# Patient Record
Sex: Male | Born: 1955 | Race: White | Hispanic: No | Marital: Single | Smoking: Never smoker
Health system: Southern US, Community
[De-identification: ages and names within clinical notes are randomized; demographics above are authoritative.]

## PROBLEM LIST (undated history)

## (undated) ENCOUNTER — Emergency Department (HOSPITAL_COMMUNITY): Admission: EM | Payer: 59 | Source: Home / Self Care

## (undated) DIAGNOSIS — E785 Hyperlipidemia, unspecified: Secondary | ICD-10-CM

## (undated) DIAGNOSIS — N200 Calculus of kidney: Secondary | ICD-10-CM

## (undated) DIAGNOSIS — F1020 Alcohol dependence, uncomplicated: Secondary | ICD-10-CM

## (undated) DIAGNOSIS — I10 Essential (primary) hypertension: Secondary | ICD-10-CM

---

## 2018-06-17 ENCOUNTER — Inpatient Hospital Stay (HOSPITAL_COMMUNITY)
Admission: EM | Admit: 2018-06-17 | Discharge: 2018-06-30 | DRG: 897 | Disposition: A | Discharge status: Skilled Nursing Facility | Payer: 59 | Attending: Family Medicine | Admitting: Family Medicine

## 2018-06-17 ENCOUNTER — Encounter (HOSPITAL_COMMUNITY): Payer: Self-pay | Admitting: Emergency Medicine

## 2018-06-17 ENCOUNTER — Emergency Department (HOSPITAL_COMMUNITY): Payer: 59

## 2018-06-17 DIAGNOSIS — D72829 Elevated white blood cell count, unspecified: Secondary | ICD-10-CM | POA: Diagnosis present

## 2018-06-17 DIAGNOSIS — K7011 Alcoholic hepatitis with ascites: Secondary | ICD-10-CM | POA: Diagnosis present

## 2018-06-17 DIAGNOSIS — E8770 Fluid overload, unspecified: Secondary | ICD-10-CM | POA: Diagnosis not present

## 2018-06-17 DIAGNOSIS — R3129 Other microscopic hematuria: Secondary | ICD-10-CM | POA: Diagnosis present

## 2018-06-17 DIAGNOSIS — Z87442 Personal history of urinary calculi: Secondary | ICD-10-CM

## 2018-06-17 DIAGNOSIS — E878 Other disorders of electrolyte and fluid balance, not elsewhere classified: Secondary | ICD-10-CM | POA: Diagnosis present

## 2018-06-17 DIAGNOSIS — E876 Hypokalemia: Secondary | ICD-10-CM | POA: Diagnosis present

## 2018-06-17 DIAGNOSIS — F1010 Alcohol abuse, uncomplicated: Secondary | ICD-10-CM | POA: Diagnosis not present

## 2018-06-17 DIAGNOSIS — D539 Nutritional anemia, unspecified: Secondary | ICD-10-CM | POA: Diagnosis present

## 2018-06-17 DIAGNOSIS — K7689 Other specified diseases of liver: Secondary | ICD-10-CM | POA: Diagnosis not present

## 2018-06-17 DIAGNOSIS — F10231 Alcohol dependence with withdrawal delirium: Secondary | ICD-10-CM | POA: Diagnosis present

## 2018-06-17 DIAGNOSIS — G4700 Insomnia, unspecified: Secondary | ICD-10-CM | POA: Diagnosis present

## 2018-06-17 DIAGNOSIS — R7401 Elevation of levels of liver transaminase levels: Secondary | ICD-10-CM

## 2018-06-17 DIAGNOSIS — D696 Thrombocytopenia, unspecified: Secondary | ICD-10-CM | POA: Diagnosis not present

## 2018-06-17 DIAGNOSIS — F419 Anxiety disorder, unspecified: Secondary | ICD-10-CM | POA: Diagnosis present

## 2018-06-17 DIAGNOSIS — R740 Nonspecific elevation of levels of transaminase and lactic acid dehydrogenase [LDH]: Secondary | ICD-10-CM | POA: Diagnosis present

## 2018-06-17 DIAGNOSIS — D6959 Other secondary thrombocytopenia: Secondary | ICD-10-CM | POA: Diagnosis present

## 2018-06-17 DIAGNOSIS — E785 Hyperlipidemia, unspecified: Secondary | ICD-10-CM

## 2018-06-17 DIAGNOSIS — R4182 Altered mental status, unspecified: Secondary | ICD-10-CM | POA: Insufficient documentation

## 2018-06-17 DIAGNOSIS — E440 Moderate protein-calorie malnutrition: Secondary | ICD-10-CM | POA: Diagnosis present

## 2018-06-17 DIAGNOSIS — Z79899 Other long term (current) drug therapy: Secondary | ICD-10-CM

## 2018-06-17 DIAGNOSIS — K7040 Alcoholic hepatic failure without coma: Secondary | ICD-10-CM | POA: Diagnosis present

## 2018-06-17 DIAGNOSIS — I10 Essential (primary) hypertension: Secondary | ICD-10-CM | POA: Diagnosis present

## 2018-06-17 DIAGNOSIS — D689 Coagulation defect, unspecified: Secondary | ICD-10-CM | POA: Diagnosis present

## 2018-06-17 DIAGNOSIS — K5900 Constipation, unspecified: Secondary | ICD-10-CM | POA: Diagnosis present

## 2018-06-17 DIAGNOSIS — Z6823 Body mass index (BMI) 23.0-23.9, adult: Secondary | ICD-10-CM | POA: Diagnosis not present

## 2018-06-17 DIAGNOSIS — E871 Hypo-osmolality and hyponatremia: Secondary | ICD-10-CM | POA: Diagnosis not present

## 2018-06-17 DIAGNOSIS — K219 Gastro-esophageal reflux disease without esophagitis: Secondary | ICD-10-CM | POA: Diagnosis present

## 2018-06-17 DIAGNOSIS — K7031 Alcoholic cirrhosis of liver with ascites: Secondary | ICD-10-CM | POA: Diagnosis present

## 2018-06-17 DIAGNOSIS — D649 Anemia, unspecified: Secondary | ICD-10-CM | POA: Diagnosis not present

## 2018-06-17 DIAGNOSIS — E872 Acidosis: Secondary | ICD-10-CM | POA: Diagnosis present

## 2018-06-17 DIAGNOSIS — K7200 Acute and subacute hepatic failure without coma: Secondary | ICD-10-CM | POA: Diagnosis not present

## 2018-06-17 DIAGNOSIS — R278 Other lack of coordination: Secondary | ICD-10-CM | POA: Diagnosis present

## 2018-06-17 DIAGNOSIS — F10931 Alcohol use, unspecified with withdrawal delirium: Secondary | ICD-10-CM

## 2018-06-17 DIAGNOSIS — R198 Other specified symptoms and signs involving the digestive system and abdomen: Secondary | ICD-10-CM | POA: Diagnosis not present

## 2018-06-17 DIAGNOSIS — R188 Other ascites: Secondary | ICD-10-CM

## 2018-06-17 DIAGNOSIS — R109 Unspecified abdominal pain: Secondary | ICD-10-CM | POA: Diagnosis present

## 2018-06-17 HISTORY — DX: Alcohol dependence, uncomplicated: F10.20

## 2018-06-17 HISTORY — DX: Calculus of kidney: N20.0

## 2018-06-17 HISTORY — DX: Hyperlipidemia, unspecified: E78.5

## 2018-06-17 HISTORY — DX: Essential (primary) hypertension: I10

## 2018-06-17 LAB — CBC WITH DIFFERENTIAL/PLATELET
ABS IMMATURE GRANULOCYTES: 0.1 10*3/uL — AB (ref 0.00–0.07)
BASOS PCT: 1 %
Basophils Absolute: 0.1 10*3/uL (ref 0.0–0.1)
Eosinophils Absolute: 0.1 10*3/uL (ref 0.0–0.5)
Eosinophils Relative: 1 %
HCT: 34.4 % — ABNORMAL LOW (ref 39.0–52.0)
Hemoglobin: 11.9 g/dL — ABNORMAL LOW (ref 13.0–17.0)
IMMATURE GRANULOCYTES: 1 %
LYMPHS PCT: 11 %
Lymphs Abs: 1.4 10*3/uL (ref 0.7–4.0)
MCH: 35.2 pg — ABNORMAL HIGH (ref 26.0–34.0)
MCHC: 34.6 g/dL (ref 30.0–36.0)
MCV: 101.8 fL — AB (ref 80.0–100.0)
Monocytes Absolute: 1 10*3/uL (ref 0.1–1.0)
Monocytes Relative: 8 %
NEUTROS ABS: 10.3 10*3/uL — AB (ref 1.7–7.7)
NEUTROS PCT: 78 %
PLATELETS: 118 10*3/uL — AB (ref 150–400)
RBC: 3.38 MIL/uL — ABNORMAL LOW (ref 4.22–5.81)
RDW: 15.2 % (ref 11.5–15.5)
WBC: 13 10*3/uL — ABNORMAL HIGH (ref 4.0–10.5)
nRBC: 0 % (ref 0.0–0.2)

## 2018-06-17 LAB — URINALYSIS, ROUTINE W REFLEX MICROSCOPIC
RBC / HPF: >50 RBC/hpf — ABNORMAL HIGH (ref 0–5)
WBC, UA: >50 WBC/hpf — ABNORMAL HIGH (ref 0–5)

## 2018-06-17 LAB — I-STAT CG4 LACTIC ACID, ED: Lactic Acid, Venous: 4.53 mmol/L (ref 0.5–1.9)

## 2018-06-17 LAB — PROTIME-INR
INR: 1.98
PROTHROMBIN TIME: 22.2 s — AB (ref 11.4–15.2)

## 2018-06-17 LAB — COMPREHENSIVE METABOLIC PANEL
ALBUMIN: 2.3 g/dL — AB (ref 3.5–5.0)
ALT: 85 U/L — ABNORMAL HIGH (ref 0–44)
AST: 154 U/L — ABNORMAL HIGH (ref 15–41)
Alkaline Phosphatase: 73 U/L (ref 38–126)
Anion gap: 10 (ref 5–15)
BUN: 10 mg/dL (ref 6–20)
CHLORIDE: 87 mmol/L — AB (ref 98–111)
CO2: 29 mmol/L (ref 22–32)
Calcium: 9.6 mg/dL (ref 8.9–10.3)
Creatinine, Ser: 0.91 mg/dL (ref 0.61–1.24)
GFR calc Af Amer: >60 mL/min (ref >60)
GFR calc non Af Amer: >60 mL/min (ref >60)
GLUCOSE: 156 mg/dL — AB (ref 70–99)
Potassium: 3.3 mmol/L — ABNORMAL LOW (ref 3.5–5.1)
Sodium: 126 mmol/L — ABNORMAL LOW (ref 135–145)
Total Bilirubin: 12.8 mg/dL — ABNORMAL HIGH (ref 0.3–1.2)
Total Protein: 8.4 g/dL — ABNORMAL HIGH (ref 6.5–8.1)

## 2018-06-17 LAB — CBG MONITORING, ED: GLUCOSE-CAPILLARY: 150 mg/dL — AB (ref 70–99)

## 2018-06-17 LAB — AMMONIA: Ammonia: 31 umol/L (ref 9–35)

## 2018-06-17 MED ORDER — IOHEXOL 300 MG/ML  SOLN
100.0000 mL | Freq: Once | INTRAMUSCULAR | Status: AC | PRN
  Administered 2018-06-17: 100 mL via INTRAVENOUS

## 2018-06-17 MED ORDER — VITAMIN B-1 100 MG PO TABS
100.0000 mg | ORAL_TABLET | Freq: Every day | ORAL | Status: DC
  Administered 2018-06-18 – 2018-06-30 (×13): 100 mg via ORAL
  Filled 2018-06-17 (×13): qty 1

## 2018-06-17 MED ORDER — LORAZEPAM 2 MG/ML IJ SOLN
1.0000 mg | Freq: Four times a day (QID) | INTRAMUSCULAR | Status: DC | PRN
  Administered 2018-06-20 (×2): 1 mg via INTRAVENOUS
  Filled 2018-06-17 (×3): qty 1

## 2018-06-17 MED ORDER — SODIUM CHLORIDE 0.9 % IV BOLUS
1000.0000 mL | Freq: Once | INTRAVENOUS | Status: AC
  Administered 2018-06-17: 1000 mL via INTRAVENOUS

## 2018-06-17 MED ORDER — ADULT MULTIVITAMIN W/MINERALS CH
1.0000 | ORAL_TABLET | Freq: Every day | ORAL | Status: DC
  Administered 2018-06-18 – 2018-06-30 (×13): 1 via ORAL
  Filled 2018-06-17 (×13): qty 1

## 2018-06-17 MED ORDER — LORAZEPAM 2 MG/ML IJ SOLN
2.0000 mg | Freq: Once | INTRAMUSCULAR | Status: AC
  Administered 2018-06-18: 2 mg via INTRAVENOUS
  Filled 2018-06-17: qty 1

## 2018-06-17 MED ORDER — LACTATED RINGERS IV BOLUS
1000.0000 mL | Freq: Once | INTRAVENOUS | Status: DC
  Administered 2018-06-17: 1000 mL via INTRAVENOUS

## 2018-06-17 MED ORDER — LORAZEPAM 1 MG PO TABS
1.0000 mg | ORAL_TABLET | Freq: Four times a day (QID) | ORAL | Status: DC | PRN
  Administered 2018-06-18 (×3): 1 mg via ORAL
  Filled 2018-06-17 (×4): qty 1

## 2018-06-17 MED ORDER — FOLIC ACID 1 MG PO TABS
1.0000 mg | ORAL_TABLET | Freq: Every day | ORAL | Status: DC
  Administered 2018-06-18 – 2018-06-30 (×13): 1 mg via ORAL
  Filled 2018-06-17 (×13): qty 1

## 2018-06-17 MED ORDER — THIAMINE HCL 100 MG/ML IJ SOLN
100.0000 mg | Freq: Every day | INTRAMUSCULAR | Status: DC
  Administered 2018-06-24: 100 mg via INTRAVENOUS
  Filled 2018-06-17 (×10): qty 2

## 2018-06-17 NOTE — ED Provider Notes (Signed)
I saw and evaluated the patient, reviewed the resident's note and I agree with the findings and plan.  EKG: None Patient has history of heavy alcohol use.  He presents with confusion and jaundice.  He has become increasingly confused throughout the day.  He endorses some generalized dull abdominal pain.  Patient's last drink was this morning.  Patient is alert and interactive.  He is very talkative.no  Respiratory distress.  Abdomen is mildly distended but soft without guarding.  Patient has slight tremor and speech is pressured.  Patient has multifactorial medical issues at this time.  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  His speech is situationally oriented but pressured and likely due to early alcohol withdrawal.  Will require admission for further evaluation and management.  CRITICAL CARE Performed by: Arby Barrette   Total critical care time: 30 minutes  Critical care time was exclusive of separately billable procedures and treating other patients.  Critical care was necessary to treat or prevent imminent or life-threatening deterioration.  Critical care was time spent personally by me on the following activities: development of treatment plan with patient and/or surrogate as well as nursing, discussions with consultants, evaluation of patient's response to treatment, examination of patient, obtaining history from patient or surrogate, ordering and performing treatments and interventions, ordering and review of laboratory studies, ordering and review of radiographic studies, pulse oximetry and re-evaluation of patient's condition.    Arby Barrette, MD 06/20/18 [phone]

## 2018-06-17 NOTE — H&P (Addendum)
[facility] Admission History and Physical Service Pager: [phone]  Patient name: Alfred Kelley Medical record number: [id number] Date of birth: 06/25/1956 Age: 62 y.o. Gender: male  Primary Care Provider: Patient, No Pcp Per Consultants: None Code Status: FULL   Chief Complaint: abdominal pain, jaundice, hematuria  Assessment and Plan: Alfred Kelley is a 62 y.o. male presenting with jaundice, abdominal pain/bloating, intractable vomiting. PMH is significant for essential hypertension, HLD, h/o alcohol abuse, and ?possible h/o of hepatitis.   Jaundice/abdominal pain- Patient with h/o alcohol use disorder states that he has had hepatitis many years ago which sounds like hepatitis A from history but he is not sure and it is not in chart review. He has history of drinking problem with abstinence for several years until about 8 months ago. He drinks ~4 glasses of wine per day. He also vomits approximately 4 days per week 2/2 abdominal pain which is relieved from vomiting. Denies any blood in vomit or stool. In ED- patient received 1 dose ativan and 1L NS bolus. Vital signs stable with tachycardia in 109-132 range. CT abdomen showed signs of probable cirrhosis, possible acute hepatitis. Labs showed total protein 8.4, albumin 2.3, AST 154, ALT 85, alk phos 73, total bili 12.8, PT 22.2, INR 1.98, ammonia 31, LA 4.53, WBC 13.0. on exam- positive scleral icterus, liver edge palpated approximately 9cm below costal margin, negative fluid wave, mild diffuse tenderness on right abdomen. Very mild JVD. No apparent tremor. Symptoms likely 2/2 cirrhosis/liver dysfunction from alcohol abuse and possibly hepatitis. Patient denies IV drug use. Another possibility given patient's weight loss is malignancy however no supraclavicular lymphadenitis and he reports this weight loss is intentional over the last 5 months as he has been trying to eat healthier and exercise. Will admit for workup  of his jaundice and elevated LFTs.  -admit to telemetry, attending Dr. Gwendlyn Deutscher - continuous cardiac monitoring - trend lactic acid - hepatitis panel - UDS - alcohol level - HIV antibody - consider GI consult - vitals per unit routine  Alcohol use disorder- patient states his last drink was today. He has had alcohol problem in the past and stopped drinking for several years. He resumed drinking ~60month ago 2/2 increased work stress. He states that he usually drinks ~4 glasses of wine per day but brother reminded him that he also drinks other drinks. Patient admits to drinking as many as 5 drinks in a day to help him calm his mind and sleep at night otherwise his mind races. He has no history of mental illness and no family history of mental illness. - monitor closely on CIWA protocol; Ativan per CIWA protocol - folic acid  - vitamin B1 - thiamine - consider psychology referral - encourage alcohol cessation  Hypertension- patient has not taken blood pressure medications for 2 days 2/2 nausea.  At home on lisinopril-hydrochlorothiazide 10-12.52m  He states that his BP is well controlled normally and he is otherwise compliant. On exam, BP was 117/69.  - hold home lisinopril-hydrochlorothiazide 10-12.49m749miven low-normal pressures - restart when able - monitor BPs   Hyponatremia- Na+ 126 on admission. Likely secondary to patient admitted large alcohol and water intake. Patient may be symptomatic with confusion- brother states this is his baseline mental status. Patient received 1L NS bolus in ED. - CMP am - PO fluid restrict - monitor I/O's - daily weights  Hypokalemia- K+ 3.3 on admission. Likely secondary to patient admitted large alcohol and water intake. - CMP  am - repleted with 69mq K+  Hypochloremia- Cl 87 on admission. Likely secondary to patient admitted large alcohol and water intake. - continue to monitor - CMP am  Hematuria- h/o frequent nephrolithiasis. CT abdomen  showed punctate left nonobstructing stone. Urinalysis revealed >50 RBC, >50WBC, rare bacteria, other tests not reported due to red color interference. Patient denies any pain with urination at any time. He states that he has had 4 days of pink urine last week. This week his urine has been cranberry-colored. He denies smoking history. - outpatient urology referral  Macrocytic Anemia- CBC shows hgb 11.9, MCV 101.8 with extensive alcohol use history and AST 1956- folic acid - vitamin BL87- alcohol cessation counseling  Thrombocytopenia- platelets 118.  No signs of active bleeding except reported blood in urine several days a week.  - continue to monitor  - monitor on CBC   Hyperlipidemia- patient takes simvastatin at home. No lipid panel in chart review  - holding simvastatin 467mdaily in setting of lactic acidosis -restart when able  - consider lipid panel  Moderate protein malnutrition- albumin 2.3. Patient has abdominal bloating, negative fluid wave, no lower extremity swelling. - nutrition consult - continue to monitor  GERD- patient states that he takes tums frequently at home for abdominal pain that burns and is relieved with tums and vomiting. -TUMS PRN  FEN/GI: heart health diet, s/p 1L NS bolus in ED Prophylaxis: lovenox  Disposition: admit to tele, attending Dr. EnGwendlyn Deutscher History of Present Illness:  DaLAWERANCE MATSUOs a [age].o. male presenting with jaundice, abdominal pain/bloating, intractable nausea, increased alcohol use.  Patient states that he has had increased alcohol intake 2/2 increased work stress ~22m26monthgo after several years of abstinence. He states that he drinks ~4 glasses of wine per night to help him relax and sleep otherwise his mind races all night. Patient's brother provides additional detail that patient also drinks other hard liquor drinks often and suggests patient is not describing his full drinking habits. Brother also states that he noticed the  patient's eyes have become yellow appearing for several days and the patient will doze off several times during the day but will then wake up with "manic speech".  Patient had asked his PCP for sleep aid medication and states he was not prescribed one. Patient has associated abdominal bloating and burning that has been occurring about 4x per week for the past 3 months which is relieved with tums and vomiting. He states that he vomits large volumes ~4x per week with no blood and then feels like he can go play basketball. He has had decreased appetite for several days. He denies any diarrhea or constipation. He states that he also has had pink urine for 4 days last week which resolved with him consuming large amounts of water. He states that he usually drinks ~3bottles of water per day. His urine has now started to appear like cranberry juice today despite consuming the water. He denies any pain with urination but does have history of kidney stones. Patient states that he has lost about 30 pounds in the past 5 months from eating a healthy diet and no exercise. He denies any fevers, night sweats, or chills. Patient has a history of hypertension but has not taken his blood pressure medication in 2 days 2/2 nausea. He has a history of hepatitis several years ago associated with "orange juice" urine and nausea for which he was treated by a doctor at [employer]  [facility]- he has not followed up since- likely HepA.  He has a history of several kidney stones not requiring intervention in the past.  Review Of Systems: Per HPI with the following additions:   Review of Systems  Constitutional: Positive for malaise/fatigue and weight loss. Negative for chills, diaphoresis and fever.  HENT: Positive for congestion.   Cardiovascular: Negative for chest pain, palpitations and leg swelling.  Gastrointestinal: Positive for abdominal pain, heartburn, nausea and vomiting. Negative for blood in stool, constipation, diarrhea and  melena.  Genitourinary: Positive for hematuria. Negative for dysuria, flank pain, frequency and urgency.  Musculoskeletal: Negative for back pain and joint pain.  Skin: Negative for rash.  Neurological: Positive for weakness. Negative for tremors.  Endo/Heme/Allergies: Positive for polydipsia.  Psychiatric/Behavioral: Positive for substance abuse. The patient has insomnia.    Patient Active Problem List   Diagnosis Date Noted  . Liver dysfunction 06/17/2018  . Altered mental status   . Hyperbilirubinemia   . Hyponatremia    Past Medical History: Past Medical History:  Diagnosis Date  . Alcoholism ([place])   . Hyperlipidemia   . Hypertension   . Kidney stones    Past Surgical History: History reviewed. No pertinent surgical history.  Social History: Social History   Tobacco Use  . Smoking status: Never Smoker  . Smokeless tobacco: Never Used  Substance Use Topics  . Alcohol use: Yes    Alcohol/week: 5.0 standard drinks    Types: 5 Glasses of wine per week    Comment: daily  . Drug use: Never   Additional social history:  Lives at home alone, never smoker, denies h/o illicit drug use.  Please also refer to relevant sections of EMR.  Family History: No family history on file.  No family history of mental illness  Allergies and Medications: No Known Allergies No current facility-administered medications on file prior to encounter.    Current Outpatient Medications on File Prior to Encounter  Medication Sig Dispense Refill  . lisinopril-hydrochlorothiazide (PRINZIDE,ZESTORETIC) 10-12.5 MG tablet Take 1 tablet by mouth daily.  0  . simvastatin (ZOCOR) 40 MG tablet Take 40 mg by mouth daily.   5   Objective: BP 117/69   Pulse (!) 116   Temp 98.3 F (36.8 C) (Oral)   Resp 18   Ht 5' 9"  (1.753 m)   Wt 72.6 kg   SpO2 97%   BMI 23.63 kg/m    Exam: General: well-appearing, NAD Eyes: mild scleral icterus, PERRLA ENTM: moist mucous membranes, multiple circular  lesions on tongue Neck: soft, non-tender to palpation, no cervical or supraclavicular lymphadenopathy Cardiovascular: tachycardic, no murmur appreciated Respiratory: CTAB Gastrointestinal: active BS diffusely, no tenderness to palpation on left, mild tenderness on right, liver edge palpated ~9cm below costal margin, negative fluid wave, negative murphy's sign, no tremor noted MSK: no gross abnormalities Derm: no rashes noted, few scattered seborrhoic keratoses on back/shoulders. Solitary scab on right shin  Neuro: alert and oriented, no focal deficits Psych: follows command, cooperative, repetitive and tangential speech pattern  Labs and Imaging: CBC BMET  Recent Labs  Lab 06/17/18 2047  WBC 13.0*  HGB 11.9*  HCT 34.4*  PLT 118*   Recent Labs  Lab 06/17/18 2047  NA 126*  K 3.3*  CL 87*  CO2 29  BUN 10  CREATININE 0.91  GLUCOSE 156*  CALCIUM 9.6     Dg Chest 1 View  Result Date: 06/17/2018 CLINICAL DATA:  Tachycardia.  Possible alcohol withdrawal. EXAM: CHEST  1  VIEW COMPARISON:  None. FINDINGS: The heart size and mediastinal contours are within normal limits. Both lungs are clear. The visualized skeletal structures are unremarkable. IMPRESSION: Clear lungs. Electronically Signed   By: Ulyses Jarred M.D.   On: 06/17/2018 22:16   Ct Abdomen Pelvis W Contrast  Result Date: 06/17/2018 CLINICAL DATA:  62 y/o  M; acute generalized abdominal pain. EXAM: CT ABDOMEN AND PELVIS WITH CONTRAST TECHNIQUE: Multidetector CT imaging of the abdomen and pelvis was performed using the standard protocol following bolus administration of intravenous contrast. CONTRAST:  100 cc Omnipaque 300 COMPARISON:  None. FINDINGS: Lower chest: No acute abnormality. Hepatobiliary: Decreased attenuation in diffuse heterogeneity of the liver. Recanalization of umbilical vein and mesenteric collateralization. No focal liver lesion. Normal appearance of the gallbladder. No biliary ductal dilatation. Pancreas:  Unremarkable. No pancreatic ductal dilatation or surrounding inflammatory changes. Spleen: Normal in size without focal abnormality. Adrenals/Urinary Tract: Adrenal glands are unremarkable. Punctate left kidney interpolar nonobstructing stone. Ureter stone or hydronephrosis. Normal bladder. Stomach/Bowel: Stomach is within normal limits. Appendix appears normal. No evidence of bowel wall thickening, distention, or inflammatory changes. Vascular/Lymphatic: Aortic atherosclerosis. No enlarged abdominal or pelvic lymph nodes. Reproductive: Prostate is unremarkable. Other: Small volume of ascites. Musculoskeletal: No fracture is seen. IMPRESSION: 1. Heterogeneity of the liver parenchyma, small volume ascites, recanalization of the umbilical vein, and mesenteric collaterals. 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 2.  Aortic Atherosclerosis (ICD10-I70.0). 3. Punctate left kidney nonobstructing stone. Electronically Signed   By: Kristine Garbe M.D.   On: 06/17/2018 22:50    Richarda Osmond, DO 06/17/2018, 11:55 PM PGY-1, [place] Intern pager: [phone], text pages welcome  I have seen and evaluated the above patient with Dr. Ouida Sills and agree with her documentation.  I have included Alfred edits in blue.   Lovenia Kim MD [place] PGY-3

## 2018-06-17 NOTE — ED Provider Notes (Signed)
[facility] EMERGENCY DEPARTMENT Provider Note   CSN: [id number] Arrival date & time: 06/17/18  2029     History   Chief Complaint Chief Complaint  Patient presents with  . Abdominal Pain  . Anxiety  . Altered Mental Status    HPI Alfred Kelley is a 62 y.o. male.  HPI Patient is a 62 year old male with a past medical history of alcoholism, hyperlipidemia, hypertension, and recurrent nephrolithiasis who presents to the emergency department for evaluation of confusion and jaundice.  Patient is accompanied by his brother who provides part of the history. During my interview with the patient, he reports that his reason for coming to the emergency department today was for evaluation of hematuria.  That has been present for approximately 5 days.  He also reports mild, generalized abdominal pain that is dull and achy in nature nonradiating that has been present for the same amount of time.  However, on exam patient is very repetitive and does seem somewhat confused despite being alert and oriented x3.  I spoke with patient's brother individually who also reports that his brother has seemed very confused throughout the day.  He reports that he has been drinking excessively secondary to stress related to his job.  Reports that after starting to drink more he has noticed his brother has become more disheveled in appearance, more jaundiced, and generally more confused.  Also of note, patient reportedly has lost approximately 30 pounds over the last 4 months.  States that this is intentional in an attempt to become more healthy.  Remaining review of systems is as detailed below.  Past Medical History:  Diagnosis Date  . Alcoholism (HCC)   . Hyperlipidemia   . Hypertension   . Kidney stones     Patient Active Problem List   Diagnosis Date Noted  . Liver dysfunction 06/17/2018  . Altered mental status   . Hyperbilirubinemia   . Hyponatremia     History reviewed. No  pertinent surgical history.      Home Medications    Prior to Admission medications   Medication Sig Start Date End Date Taking? Authorizing Provider  lisinopril-hydrochlorothiazide (PRINZIDE,ZESTORETIC) 10-12.5 MG tablet Take 1 tablet by mouth daily. 03/17/18  Yes [provider]  simvastatin (ZOCOR) 40 MG tablet Take 40 mg by mouth daily.  05/26/18  Yes [provider]    Family History No family history on file.  Social History Social History   Tobacco Use  . Smoking status: Never Smoker  . Smokeless tobacco: Never Used  Substance Use Topics  . Alcohol use: Yes    Alcohol/week: 5.0 standard drinks    Types: 5 Glasses of wine per week    Comment: daily  . Drug use: Never     Allergies   Patient has no known allergies.   Review of Systems Review of Systems  Constitutional: Negative for chills and fever.  HENT: Negative for ear pain and sore throat.   Eyes: Negative for pain and visual disturbance.  Respiratory: Negative for cough and shortness of breath.   Cardiovascular: Negative for chest pain and palpitations.  Gastrointestinal: Positive for abdominal pain. Negative for vomiting.  Endocrine: Negative for polyuria.  Genitourinary: Positive for hematuria. Negative for difficulty urinating, dysuria, flank pain, frequency and urgency.  Musculoskeletal: Negative for arthralgias and back pain.  Skin: Negative for color change and rash.  Neurological: Negative for seizures and syncope.  Psychiatric/Behavioral: Positive for confusion.  All other systems reviewed and are  negative.    Physical Exam Updated Vital Signs BP 117/69   Pulse (!) 116   Temp 98.3 F (36.8 C) (Oral)   Resp 18   Ht 5\' 9"  (1.753 m)   Wt 72.6 kg   SpO2 97%   BMI 23.63 kg/m   Physical Exam  Constitutional: He is oriented to person, place, and time. He appears well-developed and well-nourished.  HENT:  Head: Normocephalic and atraumatic.  Eyes: Conjunctivae are  normal. Scleral icterus is present.  Neck: Neck supple.  Cardiovascular: Regular rhythm. Tachycardia present.  Pulmonary/Chest: Effort normal and breath sounds normal. No respiratory distress.  Abdominal: Soft. He exhibits distension and ascites. There is no tenderness.  Musculoskeletal: He exhibits edema (Bilateral lower extremity's.).  Neurological: He is alert and oriented to person, place, and time. No cranial nerve deficit or sensory deficit.  Patient very repetitive during interview.  Skin: Skin is warm and dry.  Patient with jaundice on exam.  Psychiatric: He has a normal mood and affect.  Nursing note and vitals reviewed.    ED Treatments / Results  Labs (all labs ordered are listed, but only abnormal results are displayed) Labs Reviewed  CBC WITH DIFFERENTIAL/PLATELET - Abnormal; Notable for the following components:      Result Value   WBC 13.0 (*)    RBC 3.38 (*)    Hemoglobin 11.9 (*)    HCT 34.4 (*)    MCV 101.8 (*)    MCH 35.2 (*)    Platelets 118 (*)    Neutro Abs 10.3 (*)    Abs Immature Granulocytes 0.10 (*)    All other components within normal limits  COMPREHENSIVE METABOLIC PANEL - Abnormal; Notable for the following components:   Sodium 126 (*)    Potassium 3.3 (*)    Chloride 87 (*)    Glucose, Bld 156 (*)    Total Protein 8.4 (*)    Albumin 2.3 (*)    AST 154 (*)    ALT 85 (*)    Total Bilirubin 12.8 (*)    All other components within normal limits  PROTIME-INR - Abnormal; Notable for the following components:   Prothrombin Time 22.2 (*)    All other components within normal limits  URINALYSIS, ROUTINE W REFLEX MICROSCOPIC - Abnormal; Notable for the following components:   Color, Urine RED (*)    APPearance TURBID (*)    Glucose, UA   (*)    Value: TEST NOT REPORTED DUE TO COLOR INTERFERENCE OF URINE PIGMENT   Hgb urine dipstick   (*)    Value: TEST NOT REPORTED DUE TO COLOR INTERFERENCE OF URINE PIGMENT   Bilirubin Urine   (*)    Value:  TEST NOT REPORTED DUE TO COLOR INTERFERENCE OF URINE PIGMENT   Ketones, ur   (*)    Value: TEST NOT REPORTED DUE TO COLOR INTERFERENCE OF URINE PIGMENT   Protein, ur   (*)    Value: TEST NOT REPORTED DUE TO COLOR INTERFERENCE OF URINE PIGMENT   Nitrite   (*)    Value: TEST NOT REPORTED DUE TO COLOR INTERFERENCE OF URINE PIGMENT   Leukocytes, UA   (*)    Value: TEST NOT REPORTED DUE TO COLOR INTERFERENCE OF URINE PIGMENT   RBC / HPF >50 (*)    WBC, UA >50 (*)    Bacteria, UA RARE (*)    All other components within normal limits  CBG MONITORING, ED - Abnormal; Notable for the following components:  Glucose-Capillary 150 (*)    All other components within normal limits  I-STAT CG4 LACTIC ACID, ED - Abnormal; Notable for the following components:   Lactic Acid, Venous 4.53 (*)    All other components within normal limits  AMMONIA  I-STAT CG4 LACTIC ACID, ED  I-STAT CG4 LACTIC ACID, ED    EKG None  Radiology Dg Chest 1 View  Result Date: 06/17/2018 CLINICAL DATA:  Tachycardia.  Possible alcohol withdrawal. EXAM: CHEST  1 VIEW COMPARISON:  None. FINDINGS: The heart size and mediastinal contours are within normal limits. Both lungs are clear. The visualized skeletal structures are unremarkable. IMPRESSION: Clear lungs. Electronically Signed   By: Deatra Robinson M.D.   On: 06/17/2018 22:16   Ct Abdomen Pelvis W Contrast  Result Date: 06/17/2018 CLINICAL DATA:  62 y/o  M; acute generalized abdominal pain. EXAM: CT ABDOMEN AND PELVIS WITH CONTRAST TECHNIQUE: Multidetector CT imaging of the abdomen and pelvis was performed using the standard protocol following bolus administration of intravenous contrast. CONTRAST:  100 cc Omnipaque 300 COMPARISON:  None. FINDINGS: Lower chest: No acute abnormality. Hepatobiliary: Decreased attenuation in diffuse heterogeneity of the liver. Recanalization of umbilical vein and mesenteric collateralization. No focal liver lesion. Normal appearance of the  gallbladder. No biliary ductal dilatation. Pancreas: Unremarkable. No pancreatic ductal dilatation or surrounding inflammatory changes. Spleen: Normal in size without focal abnormality. Adrenals/Urinary Tract: Adrenal glands are unremarkable. Punctate left kidney interpolar nonobstructing stone. Ureter stone or hydronephrosis. Normal bladder. Stomach/Bowel: Stomach is within normal limits. Appendix appears normal. No evidence of bowel wall thickening, distention, or inflammatory changes. Vascular/Lymphatic: Aortic atherosclerosis. No enlarged abdominal or pelvic lymph nodes. Reproductive: Prostate is unremarkable. Other: Small volume of ascites. Musculoskeletal: No fracture is seen. IMPRESSION: 1. Heterogeneity of the liver parenchyma, small volume ascites, recanalization of the umbilical vein, and mesenteric collaterals. 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 2.  Aortic Atherosclerosis (ICD10-I70.0). 3. Punctate left kidney nonobstructing stone. Electronically Signed   By: Mitzi Hansen M.D.   On: 06/17/2018 22:50    Procedures Procedures (including critical care time)  Medications Ordered in ED Medications  sodium chloride 0.9 % bolus 1,000 mL (1,000 mLs Intravenous New Bag/Given 06/17/18 2241)  LORazepam (ATIVAN) tablet 1 mg (has no administration in time range)    Or  LORazepam (ATIVAN) injection 1 mg (has no administration in time range)  thiamine (VITAMIN B-1) tablet 100 mg (has no administration in time range)    Or  thiamine (B-1) injection 100 mg (has no administration in time range)  folic acid (FOLVITE) tablet 1 mg (has no administration in time range)  multivitamin with minerals tablet 1 tablet (has no administration in time range)  LORazepam (ATIVAN) injection 2 mg (has no administration in time range)  iohexol (OMNIPAQUE) 300 MG/ML solution 100 mL (100 mLs Intravenous Contrast Given 06/17/18 2221)     Initial Impression / Assessment  and Plan / ED Course  I have reviewed the triage vital signs and the nursing notes.  Pertinent labs & imaging results that were available during my care of the patient were reviewed by me and considered in my medical decision making (see chart for details).     Patient is a 62 year old male with past medical history as detailed above who presents emergency department for evaluation of multiple complaints.  Patient's chief complaint is of hematuria, however his brother who is present with him at the bedside is concerned that patient is much more confused than his baseline and has  noticed worsening of patient's scleral icterus.  Secondary to patient's arrival complaint multiple laboratory studies were obtained.  Patient found to have multiple abnormalities most notable for hyponatremia, significantly elevated bilirubin, hematuria, elevated white blood cell, and significantly elevated lactic acid.  Patient's CT of the abdomen and pelvis shows heterogenicity of the liver and moderate ascites consistent with cirrhosis versus acute hepatitis.  Patient was given IV fluids and put on CIWA protocol given his history of alcoholism.  He was given a one-time dose of Ativan intravenously while in the emergency department secondary to tachycardia and some generalized tremors.  Given patient's presenting symptoms and concerning laboratory findings the internal medicine service was consulted for admission.  The patient will be accepted to their service for further evaluation and care.  Patient with no further acute events while under my care.  At this time, I believe patient's presentation is consistent with worsening liver failure most likely secondary to his history of alcoholism.  Other causes of acute liver failure will need to be evaluated during patient's inpatient stay.  I believe patient's hematuria may be secondary to his kidney stone seen on CT.  Patient does have tachycardia and an elevated white blood cell  count, however he is not febrile and does not have any infectious symptoms at this time.  As a result patient was not started on antibiotics while in the emergency department.  For further information regarding patient's continued work-up and hospital course please see admitting team's documentation.  The care of this patient was discussed with my attending physician Dr. Donnald Garre, who voices agreement with work-up and ED disposition.   Final Clinical Impressions(s) / ED Diagnoses   Final diagnoses:  Hyperbilirubinemia  Hyponatremia  Altered mental status, unspecified altered mental status type    ED Discharge Orders    None       Brylea Pita, Winfield Rast, MD 06/18/18 1914    Arby Barrette, MD 06/20/18 7829

## 2018-06-17 NOTE — ED Triage Notes (Signed)
Per patient has been under increased stress at work and has been drinking a little more than he should.  [facility] told [facility] at [facility] that he has been altered and he thinks he may be going through withdrawals.  Patient reports abdominal bloating and appears anxious in triage.

## 2018-06-17 NOTE — ED Notes (Signed)
Unable to draw labs x1 

## 2018-06-17 NOTE — ED Provider Notes (Signed)
Patient placed in Quick Look pathway, seen and evaluated   Chief Complaint: Confusion, jaundice, regular alcohol use.  HPI:   Job has become extremely stressful. Has been drinking alcohol daily for years. Is not followed by a GP. His brother brought him in today because of concern that he appears to be more yellow in color, has been confused and repeating himself.  Patient reports that his abdomen is bloated.  Has also had blood in his urine, vomiting today and has had diarrhea.  He has epigastric pain and has been eating a lot of Tums.  He has gone from 190lbs to 160lbs in 4 months, although reports that he has been eating healthier.  Last drink was earlier this morning.  ROS: No fever  Physical Exam:   Gen: No distress  Neuro: Awake and Alert  Skin: Warm    Focused Exam: Tremors in bilateral hands, no asterixis.  Abdomen with fluid wave.  Mucous membranes appear jaundice.  He also has scleral icterus.  1+ pitting edema over bilateral shins.   Initiation of care has begun. The patient has been counseled on the process, plan, and necessity for staying for the completion/evaluation, and the remainder of the medical screening examination    Lawrence Marseilles 06/17/18 2052    Arby Barrette, MD 06/20/18 0021

## 2018-06-18 DIAGNOSIS — F1010 Alcohol abuse, uncomplicated: Secondary | ICD-10-CM

## 2018-06-18 DIAGNOSIS — K7200 Acute and subacute hepatic failure without coma: Secondary | ICD-10-CM | POA: Diagnosis present

## 2018-06-18 LAB — HIV ANTIBODY (ROUTINE TESTING W REFLEX): HIV SCREEN 4TH GENERATION: NONREACTIVE

## 2018-06-18 LAB — CBC
HEMATOCRIT: 29.3 % — AB (ref 39.0–52.0)
Hemoglobin: 10.6 g/dL — ABNORMAL LOW (ref 13.0–17.0)
MCH: 36.2 pg — AB (ref 26.0–34.0)
MCHC: 36.2 g/dL — AB (ref 30.0–36.0)
MCV: 100 fL (ref 80.0–100.0)
Platelets: 103 10*3/uL — ABNORMAL LOW (ref 150–400)
RBC: 2.93 MIL/uL — ABNORMAL LOW (ref 4.22–5.81)
RDW: 15.1 % (ref 11.5–15.5)
WBC: 9.9 10*3/uL (ref 4.0–10.5)
nRBC: 0 % (ref 0.0–0.2)

## 2018-06-18 LAB — OSMOLALITY, URINE: Osmolality, Ur: 521 mOsm/kg (ref 300–900)

## 2018-06-18 LAB — RAPID URINE DRUG SCREEN, HOSP PERFORMED
Amphetamines: NOT DETECTED
BARBITURATES: NOT DETECTED
BENZODIAZEPINES: NOT DETECTED
Cocaine: NOT DETECTED
Opiates: NOT DETECTED
Tetrahydrocannabinol: NOT DETECTED

## 2018-06-18 LAB — LACTIC ACID, PLASMA: Lactic Acid, Venous: 1.9 mmol/L (ref 0.5–1.9)

## 2018-06-18 LAB — COMPREHENSIVE METABOLIC PANEL
ALK PHOS: 57 U/L (ref 38–126)
ALT: 66 U/L — AB (ref 0–44)
ANION GAP: 5 (ref 5–15)
AST: 113 U/L — AB (ref 15–41)
Albumin: 1.8 g/dL — ABNORMAL LOW (ref 3.5–5.0)
BUN: 9 mg/dL (ref 6–20)
CALCIUM: 8.6 mg/dL — AB (ref 8.9–10.3)
CO2: 30 mmol/L (ref 22–32)
CREATININE: 0.81 mg/dL (ref 0.61–1.24)
Chloride: 93 mmol/L — ABNORMAL LOW (ref 98–111)
GFR calc Af Amer: >60 mL/min (ref >60)
GFR calc non Af Amer: >60 mL/min (ref >60)
GLUCOSE: 144 mg/dL — AB (ref 70–99)
Potassium: 3 mmol/L — ABNORMAL LOW (ref 3.5–5.1)
SODIUM: 128 mmol/L — AB (ref 135–145)
Total Bilirubin: 10.5 mg/dL — ABNORMAL HIGH (ref 0.3–1.2)
Total Protein: 6.8 g/dL (ref 6.5–8.1)

## 2018-06-18 LAB — ACETAMINOPHEN LEVEL: Acetaminophen (Tylenol), Serum: <10 ug/mL — ABNORMAL LOW (ref 10–30)

## 2018-06-18 LAB — BILIRUBIN, FRACTIONATED(TOT/DIR/INDIR)
BILIRUBIN DIRECT: 3.5 mg/dL — AB (ref 0.0–0.2)
BILIRUBIN INDIRECT: 6.6 mg/dL — AB (ref 0.3–0.9)
Total Bilirubin: 10.1 mg/dL — ABNORMAL HIGH (ref 0.3–1.2)

## 2018-06-18 LAB — APTT: aPTT: 46 seconds — ABNORMAL HIGH (ref 24–36)

## 2018-06-18 LAB — PROTIME-INR
INR: 2.12
Prothrombin Time: 23.4 seconds — ABNORMAL HIGH (ref 11.4–15.2)

## 2018-06-18 LAB — ETHANOL

## 2018-06-18 LAB — OSMOLALITY: Osmolality: 282 mOsm/kg (ref 275–295)

## 2018-06-18 LAB — I-STAT CG4 LACTIC ACID, ED: Lactic Acid, Venous: 2.62 mmol/L (ref 0.5–1.9)

## 2018-06-18 LAB — GAMMA GT: GGT: 80 U/L — AB (ref 7–50)

## 2018-06-18 LAB — AMMONIA: Ammonia: 44 umol/L — ABNORMAL HIGH (ref 9–35)

## 2018-06-18 LAB — TSH: TSH: 2.327 u[IU]/mL (ref 0.350–4.500)

## 2018-06-18 LAB — SODIUM, URINE, RANDOM

## 2018-06-18 MED ORDER — CALCIUM CARBONATE ANTACID 500 MG PO CHEW
1.0000 | CHEWABLE_TABLET | Freq: Two times a day (BID) | ORAL | Status: DC | PRN
  Administered 2018-06-25 – 2018-06-28 (×3): 200 mg via ORAL
  Filled 2018-06-18 (×3): qty 1

## 2018-06-18 MED ORDER — SENNA 8.6 MG PO TABS
1.0000 | ORAL_TABLET | Freq: Two times a day (BID) | ORAL | Status: DC
  Administered 2018-06-18 – 2018-06-27 (×16): 8.6 mg via ORAL
  Filled 2018-06-18 (×20): qty 1

## 2018-06-18 MED ORDER — SODIUM CHLORIDE 0.9% FLUSH
3.0000 mL | Freq: Two times a day (BID) | INTRAVENOUS | Status: DC
  Administered 2018-06-18 – 2018-06-30 (×23): 3 mL via INTRAVENOUS

## 2018-06-18 MED ORDER — ONDANSETRON HCL 4 MG PO TABS
4.0000 mg | ORAL_TABLET | Freq: Four times a day (QID) | ORAL | Status: DC | PRN
  Administered 2018-06-27: 4 mg via ORAL
  Filled 2018-06-18 (×2): qty 1

## 2018-06-18 MED ORDER — POTASSIUM CHLORIDE CRYS ER 20 MEQ PO TBCR
40.0000 meq | EXTENDED_RELEASE_TABLET | Freq: Once | ORAL | Status: AC
  Administered 2018-06-18: 40 meq via ORAL
  Filled 2018-06-18: qty 2

## 2018-06-18 MED ORDER — VITAMIN K1 10 MG/ML IJ SOLN
10.0000 mg | Freq: Once | INTRAVENOUS | Status: AC
  Administered 2018-06-18: 10 mg via INTRAVENOUS
  Filled 2018-06-18: qty 1

## 2018-06-18 MED ORDER — ACETAMINOPHEN 325 MG PO TABS: 650.0000 mg | ORAL_TABLET | Freq: Four times a day (QID) | ORAL | Status: DC | PRN

## 2018-06-18 MED ORDER — ACETAMINOPHEN 325 MG PO TABS
325.0000 mg | ORAL_TABLET | Freq: Four times a day (QID) | ORAL | Status: DC | PRN
  Administered 2018-06-22 – 2018-06-29 (×3): 325 mg via ORAL
  Filled 2018-06-18 (×3): qty 1

## 2018-06-18 MED ORDER — ENSURE ENLIVE PO LIQD
237.0000 mL | Freq: Two times a day (BID) | ORAL | Status: DC
  Administered 2018-06-18 – 2018-06-30 (×22): 237 mL via ORAL

## 2018-06-18 MED ORDER — SODIUM CHLORIDE 0.9 % IV SOLN
INTRAVENOUS | Status: DC | PRN
  Administered 2018-06-18: 500 mL via INTRAVENOUS

## 2018-06-18 MED ORDER — ACETAMINOPHEN 650 MG RE SUPP: 650.0000 mg | Freq: Four times a day (QID) | RECTAL | Status: DC | PRN

## 2018-06-18 MED ORDER — POTASSIUM CHLORIDE CRYS ER 20 MEQ PO TBCR
40.0000 meq | EXTENDED_RELEASE_TABLET | Freq: Two times a day (BID) | ORAL | Status: AC
  Administered 2018-06-18 (×2): 40 meq via ORAL
  Filled 2018-06-18 (×2): qty 2

## 2018-06-18 MED ORDER — ACETAMINOPHEN 650 MG RE SUPP: 325.0000 mg | Freq: Four times a day (QID) | RECTAL | Status: DC | PRN

## 2018-06-18 MED ORDER — ONDANSETRON HCL 4 MG/2ML IJ SOLN
4.0000 mg | Freq: Four times a day (QID) | INTRAMUSCULAR | Status: DC | PRN
  Administered 2018-06-25: 4 mg via INTRAVENOUS
  Filled 2018-06-18: qty 2

## 2018-06-18 MED ORDER — ENOXAPARIN SODIUM 40 MG/0.4ML ~~LOC~~ SOLN
40.0000 mg | SUBCUTANEOUS | Status: DC
  Administered 2018-06-18 – 2018-06-29 (×12): 40 mg via SUBCUTANEOUS
  Filled 2018-06-18 (×12): qty 0.4

## 2018-06-18 NOTE — ED Notes (Signed)
  Attempted to call report.  RN will call me back. 

## 2018-06-18 NOTE — ED Notes (Signed)
   06/18/18 0205  Provider Notification  Provider Name/Title Family Medicine  Date Provider Notified 06/18/18  Time Provider Notified 0205  Notification Type Page  Notification Reason Other (Comment) (paged to see if pt should be on telemetry)  Response See new orders  Date of Provider Response 06/18/18  Time of Provider Response 0205   Patient placed on telemetry box #05.  Will continue to monitor.  Thanks.  Bernie Covey RN-BC, [employer]

## 2018-06-18 NOTE — Progress Notes (Addendum)
Family Medicine Teaching Service Daily Progress Note Intern Pager: [phone]  Patient name: Alfred Kelley Medical record number: [id number] Date of birth: 08/13/1956 Age: 62 y.o. Gender: male  Primary Care Provider: Patient, No Pcp Per Consultants: none Code Status: FULL  Pt Overview and Major Events to Date:  11/1 admitted for acute liver failure, alcohol withdrawal  Assessment and Plan: Alfred Kelley is a 62 yo male presenting with jaundice, abdominal pain/bloating, confusion. PMH is significant for essential hypertension, alcohol use disorder.   Acute Liver Failure: Patient presents with confusion reported by family, scleral icterus, abdominal bloating.  Extensive alcohol use history, was seen about 6 weeks ago in the [place] system with elevated LFTs.  Was referred to GI at that point but appears not to have followed up.  Now presenting with confusion, bilirubin 12.8, synthetic liver dysfunction with an INR of 1.98 on no anticoagulation at home.  He does report a history of questionable hepatitis several years ago associated with foodborne mechanism,? Hepatitis A. Acute liver failure defined by platelets <150K, INR >1.5, elevated LFTs, elevated bili.  -Consult GI, appreciate recs -Hepatitis panel pending -Monitor mental status closely -limit total APAP dose to 2g/24 hours   -add on APAP level to labs  Alcohol use disorder- history of the same with recent relapse secondary to stress.  Potential etiology of his acute liver failure.  Will use CIWA judiciously as benzodiazepines can accumulate in patients with liver dysfunction. - CIWA protocol - closely monitor MS - folic acid - vitamin B1  Hematuria- h/o frequent nephrolithiasis. CT abdomen showed punctate left nonobstructing stone. Urinalysis revealed >50 RBC, >50WBC, rare bacteria, other tests not reported due to red color interference. Patient denies any pain with urination at any time. He states that he has had 4 days of pink urine  last week. This week his urine has been cranberry-colored. He denies smoking history. - consult urology, appreciate recs: Discussed with Dr. Mena Goes - microscopic hematuria likely exacerbated by bili from liver failure, recommend outpatient cystoscopy. Reconsult if problems voiding or worsening.  Hypertension- hx of same. Stable. - hold home lisinopril-hydrochlorothiazide 10-12.5mg  - vitals per floor protocol  Hyponatremia- Na+ 126 on admission. Corrects to 129 on 11/2. Likely 2/2 fluid shifts with moderate ascites and acute liver failure.  - CMP daily - PO fluid restriction - monitor I/O's - daily weights  Hypokalemia- K+ 3.3 on admission > 3.0 after Kdur.  - CMP daily - BID x 2 doses  Macrocytic Anemia- CBC shows hgb 11.9, MCV 101.8. Likely multifactorial, will assess with anemia panel.  - anemia panel on 11/3 labs - alcohol cessation counseling  Thrombocytopenia- platelets 118 > 108, likely 2/2 acute liver failure. Consider d/c lovenox if plts continue to decrease to <50K.  - continue to monitor for signs of bleeding - CBC am  Hyperlipidemia- patient takes simvastatin at home. No lipid panel in chart review.  - holding simvastatin 40mg  daily - consider lipid panel  Moderate protein malnutrition- albumin 2.3. Patient has abdominal bloating, negative fluid wave, no lower extremity swelling. - nutrition consult - continue to monitor  GERD- patient states that he takes tums frequently at home for abdominal pain that burns and is relieved with tums and vomiting. -TUMS PRN   FEN/GI: heart healthy with fluid restriction PPx: lovenox  Disposition: continued medical workup for acute liver failure  Subjective:  Patient answers orientation questions appropriately, but repeatedly tell me he had kidney stones in 1981. He denies hx of liver dysfunction.  Cannot give me a definitive answer on if he was ever abstinent from alcohol, but denies hx of DTs or seizures.  Doesn't recall being told to go to GI in [place] about 6 weeks ago. Asks if family is going to visit him. States he doesn't feel like he has a kidney stone and hematuria began 5 days ago.   Discussed case with brother - he has been confused for some time. Did not get to GI in [place]. Brother wants to help with alcohol abstinence. Confirms full code. Family will stay with him as much as possible.   Objective: Temp:  [98.3 F (36.8 C)-98.9 F (37.2 C)] 98.9 F (37.2 C) (11/02 0346) Pulse Rate:  [109-132] 125 (11/02 0346) Resp:  [18-26] 24 (11/02 0346) BP: (107-133)/(59-83) 107/67 (11/02 0346) SpO2:  [93 %-100 %] 94 % (11/02 0346) Weight:  [72.6 kg-79.5 kg] 79.5 kg (11/02 0129) Physical Exam: General: elderly male lying in bed in NAD.  Cardiovascular: RRR, no murmur Respiratory: CTAB, easy WOB  Abdomen: soft, nontender, +fluid wave, distended Extremities: no edema, WWP  Laboratory: Recent Labs  Lab 06/17/18 2047 06/18/18 0427  WBC 13.0* 9.9  HGB 11.9* 10.6*  HCT 34.4* 29.3*  PLT 118* 103*   Recent Labs  Lab 06/17/18 2047 06/18/18 0427  NA 126* 128*  K 3.3* 3.0*  CL 87* 93*  CO2 29 30  BUN 10 9  CREATININE 0.91 0.81  CALCIUM 9.6 8.6*  PROT 8.4* 6.8  BILITOT 12.8* 10.5*  ALKPHOS 73 57  ALT 85* 66*  AST 154* 113*  GLUCOSE 156* 144*    Ammonia - 31 >44  INR 1.98 > 2.12  Imaging/Diagnostic Tests: Dg Chest 1 View  Result Date: 06/17/2018 CLINICAL DATA:  Tachycardia.  Possible alcohol withdrawal. EXAM: CHEST  1 VIEW COMPARISON:  None. FINDINGS: The heart size and mediastinal contours are within normal limits. Both lungs are clear. The visualized skeletal structures are unremarkable. IMPRESSION: Clear lungs. Electronically Signed   By: Deatra Robinson M.D.   On: 06/17/2018 22:16   Ct Abdomen Pelvis W Contrast  Result Date: 06/17/2018 CLINICAL DATA:  62 y/o  M; acute generalized abdominal pain. EXAM: CT ABDOMEN AND PELVIS WITH CONTRAST TECHNIQUE: Multidetector CT  imaging of the abdomen and pelvis was performed using the standard protocol following bolus administration of intravenous contrast. CONTRAST:  100 cc Omnipaque 300 COMPARISON:  None. FINDINGS: Lower chest: No acute abnormality. Hepatobiliary: Decreased attenuation in diffuse heterogeneity of the liver. Recanalization of umbilical vein and mesenteric collateralization. No focal liver lesion. Normal appearance of the gallbladder. No biliary ductal dilatation. Pancreas: Unremarkable. No pancreatic ductal dilatation or surrounding inflammatory changes. Spleen: Normal in size without focal abnormality. Adrenals/Urinary Tract: Adrenal glands are unremarkable. Punctate left kidney interpolar nonobstructing stone. Ureter stone or hydronephrosis. Normal bladder. Stomach/Bowel: Stomach is within normal limits. Appendix appears normal. No evidence of bowel wall thickening, distention, or inflammatory changes. Vascular/Lymphatic: Aortic atherosclerosis. No enlarged abdominal or pelvic lymph nodes. Reproductive: Prostate is unremarkable. Other: Small volume of ascites. Musculoskeletal: No fracture is seen. IMPRESSION: 1. Heterogeneity of the liver parenchyma, small volume ascites, recanalization of the umbilical vein, and mesenteric collaterals. 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 2.  Aortic Atherosclerosis (ICD10-I70.0). 3. Punctate left kidney nonobstructing stone. Electronically Signed   By: Mitzi Hansen M.D.   On: 06/17/2018 22:50     Garth Bigness, MD 06/18/2018, 7:36 AM PGY-3, [facility] FPTS Intern pager: [phone], text pages welcome

## 2018-06-18 NOTE — Progress Notes (Signed)
Initial Nutrition Assessment  DOCUMENTATION CODES:   Not applicable  INTERVENTION:  Provide Ensure Enlive po BID, each supplement provides 350 kcal and 20 grams of protein.  Encourage adequate PO intake.   NUTRITION DIAGNOSIS:   Increased nutrient needs related to chronic illness(cirrhosis, hepatitis) as evidenced by estimated needs.  GOAL:   Patient will meet greater than or equal to 90% of their needs  MONITOR:   PO intake, Supplement acceptance, Labs, Weight trends, I & O's, Skin  REASON FOR ASSESSMENT:   Consult Assessment of nutrition requirement/status  ASSESSMENT:   62 y.o. male with past medical history of alcohol abuse presents for evaluation of jaundice and confusion. Pt found to have jaundice with total bilirubin of 12.8, AST 154, ALT 85, normal alkaline phosphatase.  INR 1.98.  CBC showed mild leukocytosis with WBC 13.0,  hemoglobin of 11.9 and platelet count of 118.  CT abdomen pelvis with IV contrast showed heterogenicity of the liver parenchyma and small volume ascites with differential of cirrhosis and possible concurrent acute hepatitis.  Meal completion has been 100%. Pt reports having a good appetite currently and PTA with usual consumption of at least 3 meals a day. Pt reports having ~30 lb weight loss over the past 5 months, however has been intentional as pt has been exercising more trying to eat more healthy. RD to order nutritional supplements to aid in increased caloric and protein needs.   Labs and medications reviewed. Ammonia elevated at 44. Total bilirubin elevated at 10.1.  NUTRITION - FOCUSED PHYSICAL EXAM:    Most Recent Value  Orbital Region  No depletion  Upper Arm Region  No depletion  Thoracic and Lumbar Region  No depletion  Buccal Region  No depletion  Temple Region  No depletion  Clavicle Bone Region  No depletion  Clavicle and Acromion Bone Region  No depletion  Scapular Bone Region  Unable to assess  Dorsal Hand  No depletion   Patellar Region  No depletion  Anterior Thigh Region  No depletion  Posterior Calf Region  No depletion  Edema (RD Assessment)  None  Hair  Reviewed  Eyes  Unable to assess  Mouth  Reviewed  Skin  Reviewed  Nails  Unable to assess       Diet Order:   Diet Order            Diet Heart Room service appropriate? Yes; Fluid consistency: Thin  Diet effective now              EDUCATION NEEDS:   Not appropriate for education at this time  Skin:  Skin Assessment: Reviewed RN Assessment  Last BM:  Unknown  Height:   Ht Readings from Last 1 Encounters:  06/18/18 5\' 9"  (1.753 m)    Weight:   Wt Readings from Last 1 Encounters:  06/18/18 79.5 kg    Ideal Body Weight:  72.7 kg  BMI:  Body mass index is 25.88 kg/m.  Estimated Nutritional Needs:   Kcal:  2100-2300  Protein:  110-130 grams  Fluid:  2.1 - 2.3 L/day    Roslyn Smiling, MS, RD, LDN Pager # [phone] After hours/ weekend pager # [phone]

## 2018-06-18 NOTE — Progress Notes (Signed)
Patient urinated 300 cc bright red urine.  Also, CIWA scores since admission to the floor are 10 and 12.  PRN Ativan given at 0349.  Patient's remains very impulsive and forgetful.  At times patient states date is Sept 1920 and he is in [place].  MD called and made aware.  Will continue to monitor patient.  Bernie Covey RN-BC, [employer]

## 2018-06-18 NOTE — Consult Note (Signed)
Referring Provider: Garth Bigness, MD/ FMTS  Primary Care Physician:  Patient, No Pcp Per Primary Gastroenterologist: Gentry Fitz  Reason for Consultation: Abnormal LFTs/jaundice  HPI: Alfred Kelley is a 62 y.o. male with past medical history of alcohol abuse brought into the hospital by brother for evaluation of jaundice and confusion yesterday.  Upon initial evaluation, he was found to have jaundice with total bilirubin of 12.8, AST 154, ALT 85, normal alkaline phosphatase.  INR 1.98.  CBC showed mild leukocytosis with WBC 13.0,  hemoglobin of 11.9 and platelet count of 118.  CT abdomen pelvis with IV contrast showed heterogenicity of the liver parenchyma and small volume ascites with differential of cirrhosis and possible concurrent acute hepatitis.  GI is consulted for further evaluation.  Patient seen and examined at bedside.  History obtained with the help of his brother.  According to brother, he had not seen him for last 2 weeks but yesterday noted yellowish discoloration of the his eyes, some confusion and change in gait.  Patient with history of heavy alcohol use with ongoing alcohol use of more than 4 drinks per day.  Patient is complaining of constipation abdominal bloating.  Last bowel movement yesterday.  Denies any blood in the stool or black stool.  No history of colon cancer or liver disease.  No previous colonoscopy  Past Medical History:  Diagnosis Date  . Alcoholism (HCC)   . Hyperlipidemia   . Hypertension   . Kidney stones     History reviewed. No pertinent surgical history.  Prior to Admission medications   Medication Sig Start Date End Date Taking? Authorizing Provider  lisinopril-hydrochlorothiazide (PRINZIDE,ZESTORETIC) 10-12.5 MG tablet Take 1 tablet by mouth daily. 03/17/18  Yes [provider]  simvastatin (ZOCOR) 40 MG tablet Take 40 mg by mouth daily.  05/26/18  Yes [provider]    Scheduled Meds: . enoxaparin (LOVENOX) injection   40 mg Subcutaneous Q24H  . folic acid  1 mg Oral Daily  . multivitamin with minerals  1 tablet Oral Daily  . potassium chloride  40 mEq Oral BID  . senna  1 tablet Oral BID  . thiamine  100 mg Oral Daily   Or  . thiamine  100 mg Intravenous Daily   Continuous Infusions: PRN Meds:.acetaminophen **OR** acetaminophen, calcium carbonate, LORazepam **OR** LORazepam, ondansetron **OR** ondansetron (ZOFRAN) IV  Allergies as of 06/17/2018  . (No Known Allergies)    No family history on file.  Social History   Socioeconomic History  . Marital status: Single    Spouse name: Not on file  . Number of children: Not on file  . Years of education: Not on file  . Highest education level: Not on file  Occupational History  . Not on file  Social Needs  . Financial resource strain: Not on file  . Food insecurity:    Worry: Not on file    Inability: Not on file  . Transportation needs:    Medical: Not on file    Non-medical: Not on file  Tobacco Use  . Smoking status: Never Smoker  . Smokeless tobacco: Never Used  Substance and Sexual Activity  . Alcohol use: Yes    Alcohol/week: 5.0 standard drinks    Types: 5 Glasses of wine per week    Comment: daily  . Drug use: Never  . Sexual activity: Not on file  Lifestyle  . Physical activity:    Days per week: Not on file    Minutes per session:  Not on file  . Stress: Not on file  Relationships  . Social connections:    Talks on phone: Not on file    Gets together: Not on file    Attends religious service: Not on file    Active member of club or organization: Not on file    Attends meetings of clubs or organizations: Not on file    Relationship status: Not on file  . Intimate partner violence:    Fear of current or ex partner: Not on file    Emotionally abused: Not on file    Physically abused: Not on file    Forced sexual activity: Not on file  Other Topics Concern  . Not on file  Social History Narrative  . Not on file     Review of Systems: Review of Systems  Constitutional: Positive for malaise/fatigue and weight loss. Negative for chills and fever.  HENT: Negative for hearing loss and tinnitus.   Eyes: Negative for blurred vision and double vision.  Respiratory: Negative for cough, hemoptysis and sputum production.   Cardiovascular: Negative for chest pain and palpitations.  Gastrointestinal: Positive for constipation, heartburn and nausea. Negative for abdominal pain, blood in stool and diarrhea.  Genitourinary: Positive for frequency and hematuria.  Musculoskeletal: Positive for back pain and myalgias.  Skin: Negative for rash.  Neurological: Positive for weakness. Negative for loss of consciousness.  Endo/Heme/Allergies: Does not bruise/bleed easily.  Psychiatric/Behavioral: Negative for hallucinations and suicidal ideas.    Physical Exam: Vital signs: Vitals:   06/18/18 0346 06/18/18 0740  BP: 107/67 124/74  Pulse: (!) 125 (!) 111  Resp: (!) 24 18  Temp: 98.9 F (37.2 C)   SpO2: 94% 93%   Last BM Date: (PTA) Physical Exam  Constitutional: He appears well-developed and well-nourished. No distress.  HENT:  Head: Normocephalic and atraumatic.  Mouth/Throat: Oropharynx is clear and moist.  Eyes: EOM are normal. Scleral icterus is present.  Neck: Normal range of motion. Neck supple.  Cardiovascular: Regular rhythm.  Tachycardia  Pulmonary/Chest: Effort normal and breath sounds normal. No respiratory distress.  Abdominal: Bowel sounds are normal. He exhibits distension. There is no tenderness. There is no rebound and no guarding.  Abdomen is moderately distended  Musculoskeletal: Normal range of motion. He exhibits no edema.  Neurological:  Patient is alert and oriented but slow to response.  Skin: Skin is dry. No erythema.  Psychiatric: He has a normal mood and affect. His behavior is normal.  Nursing note and vitals reviewed.  GI:  Lab Results: Recent Labs    06/17/18 2047  06/18/18 0427  WBC 13.0* 9.9  HGB 11.9* 10.6*  HCT 34.4* 29.3*  PLT 118* 103*   BMET Recent Labs    06/17/18 2047 06/18/18 0427  NA 126* 128*  K 3.3* 3.0*  CL 87* 93*  CO2 29 30  GLUCOSE 156* 144*  BUN 10 9  CREATININE 0.91 0.81  CALCIUM 9.6 8.6*   LFT Recent Labs    06/18/18 0427  PROT 6.8  ALBUMIN 1.8*  AST 113*  ALT 66*  ALKPHOS 57  BILITOT 10.5*   PT/INR Recent Labs    06/17/18 2047 06/18/18 0427  LABPROT 22.2* 23.4*  INR 1.98 2.12     Studies/Results: Dg Chest 1 View  Result Date: 06/17/2018 CLINICAL DATA:  Tachycardia.  Possible alcohol withdrawal. EXAM: CHEST  1 VIEW COMPARISON:  None. FINDINGS: The heart size and mediastinal contours are within normal limits. Both lungs are clear. The visualized  skeletal structures are unremarkable. IMPRESSION: Clear lungs. Electronically Signed   By: Deatra Robinson M.D.   On: 06/17/2018 22:16   Ct Abdomen Pelvis W Contrast  Result Date: 06/17/2018 CLINICAL DATA:  62 y/o  M; acute generalized abdominal pain. EXAM: CT ABDOMEN AND PELVIS WITH CONTRAST TECHNIQUE: Multidetector CT imaging of the abdomen and pelvis was performed using the standard protocol following bolus administration of intravenous contrast. CONTRAST:  100 cc Omnipaque 300 COMPARISON:  None. FINDINGS: Lower chest: No acute abnormality. Hepatobiliary: Decreased attenuation in diffuse heterogeneity of the liver. Recanalization of umbilical vein and mesenteric collateralization. No focal liver lesion. Normal appearance of the gallbladder. No biliary ductal dilatation. Pancreas: Unremarkable. No pancreatic ductal dilatation or surrounding inflammatory changes. Spleen: Normal in size without focal abnormality. Adrenals/Urinary Tract: Adrenal glands are unremarkable. Punctate left kidney interpolar nonobstructing stone. Ureter stone or hydronephrosis. Normal bladder. Stomach/Bowel: Stomach is within normal limits. Appendix appears normal. No evidence of bowel wall  thickening, distention, or inflammatory changes. Vascular/Lymphatic: Aortic atherosclerosis. No enlarged abdominal or pelvic lymph nodes. Reproductive: Prostate is unremarkable. Other: Small volume of ascites. Musculoskeletal: No fracture is seen. IMPRESSION: 1. Heterogeneity of the liver parenchyma, small volume ascites, recanalization of the umbilical vein, and mesenteric collaterals. 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 2.  Aortic Atherosclerosis (ICD10-I70.0). 3. Punctate left kidney nonobstructing stone. Electronically Signed   By: Mitzi Hansen M.D.   On: 06/17/2018 22:50    Impression/Plan: -Jaundice and patient with history of heavy and ongoing alcohol use.  CT scan showed changes of cirrhosis and possibly concurrent acute hepatitis.  Most likely alcoholic hepatitis.  -Discriminant function score 48.2 and MELD 29 based on today's lab.   -Abnormal LFTs.  Trending down. -Anemia.  No overt bleeding. -Thrombocytopenia.  Could be from cirrhosis and alcohol use. -Weight loss.  CT negative for any malignancy.   complaining of 20 to 30 pound weight loss in the last several weeks.  Recommendations ------------------------- -Findings concerning for acute alcoholic hepatitis.  Possibility of decompensated cirrhosis cannot be ruled out but less likely  -Vitamin K 10 mg IV once  -Check blood culture and urine cultures to rule out infection.  Patient is afebrile.  Leukocytosis resolved. -Follow hepatitis panel and acetaminophen level. -Check CMP and PT/INR tomorrow, consider prednisone if no improvement for possible alcoholic hepatitis -Absolute alcohol abstinence discussed with the patient and patient's brother.  Long-term prognosis also discussed.   LOS: 1 day   Kathi Der  MD, FACP 06/18/2018, 8:50 AM  Contact #  [phone]

## 2018-06-18 NOTE — Evaluation (Signed)
Physical Therapy Evaluation Patient Details Name: Alfred Kelley MRN: [id number] DOB: 22-Aug-1955 Today's Date: 06/18/2018   History of Present Illness  62 y.o. male with past medical history of alcohol abuse brought into the hospital by brother for evaluation of jaundice and confusion yesterday.  Upon initial evaluation, he was found to have jaundice with total bilirubin of 12.8, AST 154, ALT 85, normal alkaline phosphatase.  INR 1.98.  CBC showed mild leukocytosis with WBC 13.0,  hemoglobin of 11.9 and platelet count of 118.  CT abdomen pelvis with IV contrast showed heterogenicity of the liver parenchyma and small volume ascites with differential of cirrhosis and possible concurrent acute hepatitis.    Clinical Impression  Pt admitted with above diagnosis. Pt currently with functional limitations due to the deficits listed below (see PT Problem List). On eval, pt required min assist transfers and min/HHA ambulation 25 feet. Pt very confused at time of eval. Suspect he will progress quickly with mobility as his med status improves. Pt will benefit from skilled PT to increase their independence and safety with mobility to allow discharge to the venue listed below.  PT to follow acutely, but foresee no needed follow up services.      Follow Up Recommendations No PT follow up;Supervision - Intermittent    Equipment Recommendations  Other (comment)(TBD)    Recommendations for Other Services       Precautions / Restrictions Precautions Precautions: Fall      Mobility  Bed Mobility Overal bed mobility: Needs Assistance Bed Mobility: Supine to Sit;Sit to Supine     Supine to sit: Supervision;HOB elevated Sit to supine: Supervision;HOB elevated   General bed mobility comments: supervision for safety, cues for sequencing  Transfers Overall transfer level: Needs assistance Equipment used: 1 person hand held assist Transfers: Sit to/from [employer] Sit to Stand: Min  assist Stand pivot transfers: Min assist       General transfer comment: assist to power up and stabilize balance. Pt retropulsive with initial stance.  Ambulation/Gait Ambulation/Gait assistance: Min assist Gait Distance (Feet): 25 Feet Assistive device: 1 person hand held assist Gait Pattern/deviations: Step-through pattern;Decreased stride length Gait velocity: decreased Gait velocity interpretation: <1.8 ft/sec, indicate of risk for recurrent falls General Gait Details: unsteady gait, retropulsive with min assist to maintain balance  Stairs            Wheelchair Mobility    Modified Rankin (Stroke Patients Only)       Balance Overall balance assessment: Needs assistance Sitting-balance support: No upper extremity supported;Feet supported Sitting balance-Leahy Scale: Fair     Standing balance support: Single extremity supported;During functional activity Standing balance-Leahy Scale: Poor Standing balance comment: reliant on external assist                             Pertinent Vitals/Pain Pain Assessment: No/denies pain    Home Living Family/patient expects to be discharged to:: Private residence Living Arrangements: Other relatives(brother) Available Help at Discharge: Family;Available 24 hours/day Type of Home: House                Prior Function Level of Independence: Independent         Comments: Pt lives alone at baseline in [place]. Plan is for d/c home with brother in [place] upon d/c.      Hand Dominance        Extremity/Trunk Assessment   Upper Extremity Assessment Upper Extremity Assessment: Overall [facility]  for tasks assessed    Lower Extremity Assessment Lower Extremity Assessment: Overall WFL for tasks assessed    Cervical / Trunk Assessment Cervical / Trunk Assessment: Normal  Communication   Communication: No difficulties  Cognition Arousal/Alertness: Awake/alert Behavior During Therapy: Flat  affect Overall Cognitive Status: Impaired/Different from baseline Area of Impairment: Orientation;Attention;Memory;Following commands;Safety/judgement;Awareness;Problem solving                 Orientation Level: Disoriented to;Time;Situation;Place Current Attention Level: Sustained Memory: Decreased short-term memory Following Commands: Follows one step commands with increased time Safety/Judgement: Decreased awareness of safety Awareness: Intellectual Problem Solving: Slow processing;Difficulty sequencing;Requires verbal cues General Comments: Confused, wide-eyed blank stare during conversation. Non-sensical tangential speech.      General Comments      Exercises     Assessment/Plan    PT Assessment Patient needs continued PT services  PT Problem List Decreased mobility;Decreased safety awareness;Decreased coordination;Decreased knowledge of precautions;Decreased activity tolerance;Decreased balance       PT Treatment Interventions DME instruction;Therapeutic activities;Gait training;Therapeutic exercise;Patient/family education;Stair training;Balance training;Functional mobility training    PT Goals (Current goals can be found in the Care Plan section)  Acute Rehab PT Goals Patient Stated Goal: not stated PT Goal Formulation: With patient/family Time For Goal Achievement: 07/02/18 Potential to Achieve Goals: Good    Frequency Min 3X/week   Barriers to discharge        Co-evaluation               AM-PAC PT "6 Clicks" Daily Activity  Outcome Measure Difficulty turning over in bed (including adjusting bedclothes, sheets and blankets)?: A Little Difficulty moving from lying on back to sitting on the side of the bed? : A Little Difficulty sitting down on and standing up from a chair with arms (e.g., wheelchair, bedside commode, etc,.)?: A Lot Help needed moving to and from a bed to chair (including a wheelchair)?: A Little Help needed walking in hospital  room?: A Little Help needed climbing 3-5 steps with a railing? : A Lot 6 Click Score: 16    End of Session Equipment Utilized During Treatment: Gait belt Activity Tolerance: Patient tolerated treatment well Patient left: in bed;with call bell/phone within reach;with family/visitor present;with bed alarm set Nurse Communication: Mobility status PT Visit Diagnosis: Unsteadiness on feet (R26.81)    Time: 1610-9604 PT Time Calculation (min) (ACUTE ONLY): 32 min   Charges:   PT Evaluation $PT Eval Moderate Complexity: 1 Mod PT Treatments $Gait Training: 8-22 mins        Aida Raider, PT  Office # [phone] Pager [phone]   Ilda Foil 06/18/2018, 4:09 PM

## 2018-06-19 ENCOUNTER — Other Ambulatory Visit: Payer: Self-pay

## 2018-06-19 ENCOUNTER — Encounter (HOSPITAL_COMMUNITY): Payer: Self-pay

## 2018-06-19 DIAGNOSIS — F1010 Alcohol abuse, uncomplicated: Secondary | ICD-10-CM

## 2018-06-19 LAB — COMPREHENSIVE METABOLIC PANEL
ALBUMIN: 2.1 g/dL — AB (ref 3.5–5.0)
ALT: 67 U/L — ABNORMAL HIGH (ref 0–44)
ANION GAP: 6 (ref 5–15)
AST: 118 U/L — ABNORMAL HIGH (ref 15–41)
Alkaline Phosphatase: 76 U/L (ref 38–126)
BUN: 9 mg/dL (ref 6–20)
CO2: 28 mmol/L (ref 22–32)
Calcium: 8.5 mg/dL — ABNORMAL LOW (ref 8.9–10.3)
Chloride: 96 mmol/L — ABNORMAL LOW (ref 98–111)
Creatinine, Ser: 0.88 mg/dL (ref 0.61–1.24)
GFR calc non Af Amer: >60 mL/min (ref >60)
GLUCOSE: 129 mg/dL — AB (ref 70–99)
POTASSIUM: 3.4 mmol/L — AB (ref 3.5–5.1)
SODIUM: 130 mmol/L — AB (ref 135–145)
TOTAL PROTEIN: 7.8 g/dL (ref 6.5–8.1)
Total Bilirubin: 9.1 mg/dL — ABNORMAL HIGH (ref 0.3–1.2)

## 2018-06-19 LAB — LIPID PANEL
CHOL/HDL RATIO: 9.8 ratio
CHOLESTEROL: 195 mg/dL (ref 0–200)
HDL: 20 mg/dL — AB (ref >40)
LDL Cholesterol: 157 mg/dL — ABNORMAL HIGH (ref 0–99)
Triglycerides: 88 mg/dL (ref <150)
VLDL: 18 mg/dL (ref 0–40)

## 2018-06-19 LAB — HEPATITIS PANEL, ACUTE
HCV Ab: 0.1 s/co ratio (ref 0.0–0.9)
HEP A IGM: NEGATIVE
HEP B C IGM: NEGATIVE
HEP B S AG: NEGATIVE

## 2018-06-19 LAB — URINE CULTURE: Culture: <10000 — AB

## 2018-06-19 LAB — CBC
HCT: 30.5 % — ABNORMAL LOW (ref 39.0–52.0)
HEMOGLOBIN: 10.6 g/dL — AB (ref 13.0–17.0)
MCH: 36.1 pg — ABNORMAL HIGH (ref 26.0–34.0)
MCHC: 34.8 g/dL (ref 30.0–36.0)
MCV: 103.7 fL — ABNORMAL HIGH (ref 80.0–100.0)
NRBC: 0 % (ref 0.0–0.2)
Platelets: 119 10*3/uL — ABNORMAL LOW (ref 150–400)
RBC: 2.94 MIL/uL — ABNORMAL LOW (ref 4.22–5.81)
RDW: 15.7 % — AB (ref 11.5–15.5)
WBC: 11.2 10*3/uL — AB (ref 4.0–10.5)

## 2018-06-19 LAB — PROTIME-INR
INR: 1.89
Prothrombin Time: 21.5 seconds — ABNORMAL HIGH (ref 11.4–15.2)

## 2018-06-19 LAB — IRON AND TIBC: Iron: 103 ug/dL (ref 45–182)

## 2018-06-19 LAB — SODIUM, URINE, RANDOM

## 2018-06-19 LAB — FERRITIN: Ferritin: 3376 ng/mL — ABNORMAL HIGH (ref 24–336)

## 2018-06-19 LAB — RETICULOCYTES
IMMATURE RETIC FRACT: 13.8 % (ref 2.3–15.9)
RBC.: 2.94 MIL/uL — ABNORMAL LOW (ref 4.22–5.81)
RETIC COUNT ABSOLUTE: 67.9 10*3/uL (ref 19.0–186.0)
RETIC CT PCT: 2.3 % (ref 0.4–3.1)

## 2018-06-19 LAB — FOLATE: FOLATE: 9.9 ng/mL (ref >5.9)

## 2018-06-19 LAB — VITAMIN B12: VITAMIN B 12: 1172 pg/mL — AB (ref 180–914)

## 2018-06-19 LAB — ALPHA-1-ANTITRYPSIN: A-1 Antitrypsin, Ser: 121 mg/dL (ref 101–187)

## 2018-06-19 MED ORDER — VITAMIN K1 10 MG/ML IJ SOLN
10.0000 mg | Freq: Once | INTRAVENOUS | Status: AC
  Administered 2018-06-19: 10 mg via INTRAVENOUS
  Filled 2018-06-19: qty 1

## 2018-06-19 MED ORDER — LORAZEPAM 2 MG/ML IJ SOLN
2.0000 mg | INTRAMUSCULAR | Status: DC | PRN
  Administered 2018-06-19 – 2018-06-20 (×4): 3 mg via INTRAVENOUS
  Filled 2018-06-19 (×4): qty 2

## 2018-06-19 MED ORDER — LORAZEPAM 2 MG/ML IJ SOLN
1.0000 mg | Freq: Once | INTRAMUSCULAR | Status: AC
  Administered 2018-06-19: 1 mg via INTRAVENOUS

## 2018-06-19 NOTE — Progress Notes (Signed)
Report received from [facility], waiting on pt to arrive in the unit.

## 2018-06-19 NOTE — Progress Notes (Signed)
[facility] Gastroenterology Progress Note  Alfred Kelley 62 y.o. December 05, 1955  CC: Abnormal LFTs   Subjective: Patient undergoing alcohol withdrawal symptoms but otherwise no other acute GI issues.  Denies abdominal pain, nausea vomiting.  ROS : Negative for chest pain and shortness of breath.  Negative for bleeding   Objective: Vital signs in last 24 hours: Vitals:   06/19/18 0439 06/19/18 0615  BP: 127/80 116/68  Pulse: (!) 113 (!) 112  Resp: 18 18  Temp: 98.4 F (36.9 C) 98.3 F (36.8 C)  SpO2: 100% 100%    Physical Exam:  General:  Alert, cooperative, no distress, appears stated age  Head:  Normocephalic, without obvious abnormality, atraumatic  Eyes:  , EOM's intact, scleral icterus noted  Lungs:   Clear to auscultation bilaterally, respirations unlabored  Heart:  Regular rate and rhythm, S1, S2 normal  Abdomen:    Mild abdominal distention, nontender, bowel sounds present, no peritoneal signs  Extremities: Extremities normal, atraumatic, no  edema  Pulses: 2+ and symmetric    Lab Results: Recent Labs    06/18/18 0427 06/19/18 0557  NA 128* 130*  K 3.0* 3.4*  CL 93* 96*  CO2 30 28  GLUCOSE 144* 129*  BUN 9 9  CREATININE 0.81 0.88  CALCIUM 8.6* 8.5*   Recent Labs    06/18/18 0427 06/18/18 0959 06/19/18 0557  AST 113*  --  118*  ALT 66*  --  67*  ALKPHOS 57  --  76  BILITOT 10.5* 10.1* 9.1*  PROT 6.8  --  7.8  ALBUMIN 1.8*  --  2.1*   Recent Labs    06/17/18 2047 06/18/18 0427 06/19/18 0557  WBC 13.0* 9.9 11.2*  NEUTROABS 10.3*  --   --   HGB 11.9* 10.6* 10.6*  HCT 34.4* 29.3* 30.5*  MCV 101.8* 100.0 103.7*  PLT 118* 103* 119*   Recent Labs    06/18/18 0427 06/19/18 0557  LABPROT 23.4* 21.5*  INR 2.12 1.89      Assessment/Plan: -Jaundice and patient with history of heavy and ongoing alcohol use.  CT scan showed changes of cirrhosis and possibly concurrent acute hepatitis.  Most likely alcoholic hepatitis.  -Discriminant function  score 48.2 (11/2) --> 38.1 today (11/03) - MELD 29 on admission, 26 today  -Abnormal LFTs.  Trending down -hepatitis panel negative -Anemia.  No overt bleeding. -Thrombocytopenia.  Could be from cirrhosis and alcohol use. -Weight loss.  CT negative for any malignancy.   complaining of 20 to 30 pound weight loss in the last several weeks.  Recommendations ------------------------- -Findings concerning for acute alcoholic hepatitis.  Possibility of decompensated cirrhosis cannot be ruled out but less likely  -Discriminant function score improved significantly compared to yesterday.  If discriminant function score improves  to less than 32 tomorrow, he may not need prednisone. -Follow blood culture and urine culture. -Check CMP and PT/INR tomorrow -Absolute alcohol abstinence discussed with the patient and patient's brother.  Long-term prognosis also discussed. -[facility] will follow up tomorrow.  Kathi Der MD, FACP 06/19/2018, 8:31 AM  Contact #  [phone]

## 2018-06-19 NOTE — Progress Notes (Addendum)
Family Medicine Teaching Service Daily Progress Note Intern Pager: [phone]  Patient name: Alfred Kelley Medical record number: [id number] Date of birth: [birth year] Age: 62 y.o. Gender: male  Primary Care Provider: Patient, No Pcp Per Consultants: GI, PT Code Status: Full  Pt Overview and Major Events to Date:  11/1 admitted for acute liver failure, alcohol withdrawal  Assessment and Plan: DAYVIAN BLIXT is a 62 yo male presenting with jaundice, abdominal pain/bloating, confusion. PMH is significant foressential hypertension, alcohol use disorder.   Acute Liver Failure: presented with confusion, scleral icterus, abdominal bloating. Extensive alcohol use history, bilirubin 12.8, synthetic liver dysfunction with an INR of 1.89 w/o anticoagulation.  He does report a history of questionable hepatitis several years ago associated with foodborne mechanism,? Hepatitis A. Acute liver failure defined by platelets <150K, INR >1.5, elevated LFTs, elevated bili. Maddrey Score 48.2 (11/2) > 38.1 today (11/3). -Consult GI, appreciate recs -Hepatitis panel pending - Will recheck Maddrey/Discriminant Function Score on 11/4. -Monitor mental status closely -limit total Tylenol dose to 2g/24 hours              Alcohol use disorder:history of the same with recent relapse secondary to stress.  Potential etiology of his acute liver failure. Will use CIWA judiciously as benzodiazepines can accumulate in patients with liver dysfunction. Folic acid wnl. - CIWA protocol - closely monitor MS - vitamin B1  Hematuria: h/o frequent nephrolithiasis. CT abdomen showed punctate left nonobstructing stone. Urinalysis revealed >50 RBC, >50WBC, rare bacteria, other tests not reported due to red color interference. Patient denies any pain with urination at any time. He states that he has had 4 days of pink urine last week. This week his urine has been cranberry-colored. He denies smoking history. - consult urology,  appreciate recs: Discussed with Dr. Junious Silk - microscopic hematuria likely exacerbated by bili from liver failure, recommend outpatient cystoscopy. Reconsult if problems voiding or worsening.  Hypertension, stable: 149/101 (11/3), history of the same - hold home lisinopril-hydrochlorothiazide 10-12.82m - vitals per floor protocol  Hyponatremia, improving:Na+ 126 on admission > 128 > 130 (11/3). Likely 2/2 fluid shifts with moderate ascites and acute liver failure.  - CMP daily - PO fluid restriction - monitor I/O's - daily weights  Hypokalemia, improving, stable:K+ 3.3 > 3.0 > 3.4 (11/3) after repletion with Kdur.  - CMP daily  Macrocytic Anemia, stable:Hgb 11.9 > 10.6, MCV 101.8. Likely multifactorial, will assess with anemia panel.   INR 1.89, reticulocyte count 2.3%, folate within normal limits. Vitamin B12, ferritin, iron and TIBC pending. - Follow-up remaining anemia panel - alcohol cessation counseling  Thrombocytopenia:platelets 118 > 108, likely 2/2 acute liver failure. Consider d/c lovenox if plts continue to decrease to <50K.   Platelets stable at 119 on 11/3. - continue to monitor for signs of bleeding  Hyperlipidemia:takes simvastatin at home. No lipid panel in chart review.  - holding simvastatin 465mdaily - lipid panel ordered  Moderate protein malnutrition: albumin 2.3. Patient has abdominal bloating, negative fluid wave, no lower extremity swelling. - nutrition consult - continue to monitor  GERD: patient states that he takes tums frequently at home for abdominal pain that burns and is relieved with tums and vomiting. -TUMS PRN   FEN/GI: heart healthy with fluid restriction PPx: lovenox  Disposition: continued medical workup for acute liver failure  Subjective:  Patient seen this morning lying in bed comfortably, with mother and brother in the room.  He is pleasant in demeanor.  Very talkative, but she mumbles words  and sentences, but without  confusion.  He denies nausea, vomiting, diarrhea, and bloody urine.  He states ambulating is difficult and he struggles with constipation.  His last bowel movement was yesterday.  He probably reports an intentional 20 to 30 pound weight loss over the last few months.  The patient himself has no other questions or concerns; however, mother and brother are asking questions regarding return of the patient's cognitive function, speech difficulties, and other anticipatory guidance for the future.  Objective: Temp:  [98.1 F (36.7 C)-98.4 F (36.9 C)] 98.3 F (36.8 C) (11/03 0615) Pulse Rate:  [110-114] 112 (11/03 0615) Resp:  [18-19] 18 (11/03 0615) BP: (110-127)/(68-80) 116/68 (11/03 0615) SpO2:  [95 %-100 %] 100 % (11/03 0615) Weight:  [78.6 kg] 78.6 kg (11/03 0615)  Physical Exam  Constitutional: He is oriented to person, place, and time. He appears well-developed and well-nourished. No distress.  HENT:  Head: Normocephalic.  Eyes: EOM are normal. Scleral icterus is present.  Cardiovascular: Regular rhythm.  Tachycardic to 110.  Pulmonary/Chest: Effort normal and breath sounds normal.  Abdominal: He exhibits distension. He exhibits no pulsatile liver. Bowel sounds are increased. There is no tenderness. No hernia.  Neurological: He is alert and oriented to person, place, and time.  Cranial nerves II through XII grossly intact, asterixis; 5/5 strength in bilateral upper and lower extremities with normal coordination; resting tremor.  Skin: Skin is warm and dry.  Psychiatric: He has a normal mood and affect.   Laboratory: Recent Labs  Lab 06/17/18 2047 06/18/18 0427 06/19/18 0557  WBC 13.0* 9.9 11.2*  HGB 11.9* 10.6* 10.6*  HCT 34.4* 29.3* 30.5*  PLT 118* 103* 119*   Recent Labs  Lab 06/17/18 2047 06/18/18 0427 06/18/18 0959 06/19/18 0557  NA 126* 128*  --  130*  K 3.3* 3.0*  --  3.4*  CL 87* 93*  --  96*  CO2 29 30  --  28  BUN 10 9  --  9  CREATININE 0.91 0.81  --  0.88   CALCIUM 9.6 8.6*  --  8.5*  PROT 8.4* 6.8  --  7.8  BILITOT 12.8* 10.5* 10.1* 9.1*  ALKPHOS 73 57  --  76  ALT 85* 66*  --  67*  AST 154* 113*  --  118*  GLUCOSE 156* 144*  --  129*   Folate (11/3): 9.9 WNL Vitamin B12: Pending Ammonia: 31 > 44. TSH (11/2): 2.327 WNL Antinuclear antibodies: Pending Alpha-1 antitrypsin: Pending INR: 1.98 > 2.12 > 1.89 (11/3) Retic Count: 2.3 wnl Ferritin: Pending Iron and TIBC: Pending Hepatitis A, B, C (11/2): Negative, within normal limits IV (11/2): Nonreactive GGT (11/2): 80 elevated Hepatic Function Latest Ref Rng & Units 06/19/2018 06/18/2018 06/18/2018  Total Protein 6.5 - 8.1 g/dL 7.8 - 6.8  Albumin 3.5 - 5.0 g/dL 2.1(L) - 1.8(L)  AST 15 - 41 U/L 118(H) - 113(H)  ALT 0 - 44 U/L 67(H) - 66(H)  Alk Phosphatase 38 - 126 U/L 76 - 57  Total Bilirubin 0.3 - 1.2 mg/dL 9.1(H) 10.1(H) 10.5(H)  Bilirubin, Direct 0.0 - 0.2 mg/dL - 3.5(H) -   Imaging/Diagnostic Tests: Dg Chest 1 View  Result Date: 06/17/2018 CLINICAL DATA:  Tachycardia.  Possible alcohol withdrawal. EXAM: CHEST  1 VIEW COMPARISON:  None. FINDINGS: The heart size and mediastinal contours are within normal limits. Both lungs are clear. The visualized skeletal structures are unremarkable. IMPRESSION: Clear lungs. Electronically Signed   By: Cletus Gash.D.  On: 06/17/2018 22:16   Ct Abdomen Pelvis W Contrast  Result Date: 06/17/2018 CLINICAL DATA:  62 y/o  M; acute generalized abdominal pain. EXAM: CT ABDOMEN AND PELVIS WITH CONTRAST TECHNIQUE: Multidetector CT imaging of the abdomen and pelvis was performed using the standard protocol following bolus administration of intravenous contrast. CONTRAST:  100 cc Omnipaque 300 COMPARISON:  None. FINDINGS: Lower chest: No acute abnormality. Hepatobiliary: Decreased attenuation in diffuse heterogeneity of the liver. Recanalization of umbilical vein and mesenteric collateralization. No focal liver lesion. Normal appearance of the gallbladder.  No biliary ductal dilatation. Pancreas: Unremarkable. No pancreatic ductal dilatation or surrounding inflammatory changes. Spleen: Normal in size without focal abnormality. Adrenals/Urinary Tract: Adrenal glands are unremarkable. Punctate left kidney interpolar nonobstructing stone. Ureter stone or hydronephrosis. Normal bladder. Stomach/Bowel: Stomach is within normal limits. Appendix appears normal. No evidence of bowel wall thickening, distention, or inflammatory changes. Vascular/Lymphatic: Aortic atherosclerosis. No enlarged abdominal or pelvic lymph nodes. Reproductive: Prostate is unremarkable. Other: Small volume of ascites. Musculoskeletal: No fracture is seen. IMPRESSION: 1. Heterogeneity of the liver parenchyma, small volume ascites, recanalization of the umbilical vein, and mesenteric collaterals. 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 2.  Aortic Atherosclerosis (ICD10-I70.0). 3. Punctate left kidney nonobstructing stone. Electronically Signed   By: Kristine Garbe M.D.   On: 06/17/2018 22:50    Daisy Floro, DO 06/19/2018, 7:58 AM PGY-1, [facility] Intern pager: [phone], text pages welcome

## 2018-06-19 NOTE — Progress Notes (Signed)
Report receiving from [facility], said she will call back.

## 2018-06-19 NOTE — Progress Notes (Signed)
CIWA score is now 18.  Constantly getting out of bed, wanting to walk the floors, and unwilling to cooperate.  MD made aware.  Orders for 1 mg IV Ativan received and given.  Order for Step Down bed received and awaiting bed availability.  Safety sitter at bedside.  Will continue to monitor patient.  Bernie Covey RN-BC, [employer]

## 2018-06-19 NOTE — Progress Notes (Signed)
Pt received form 78M, AO x 3, disoriented to time. VSS at the time of arrival. Recruitment consultant by bedside. Connected to cardiac monitor. CCMD notified. Pt has yellowish discoloration of Skin, Sclera Yellowish. Abrasion to BLE and Left forearm noted. Pt has red bumps to BLE, BUE, upper back. Denies any pain, tremors present to BUE. Unsteady gait.   Cala Bradford RN notified MD and pt's brother that pt has been transferred to 4E14. Oriented pt to room. Call bell within reach. Bed alarm on. Will continue to monitor.

## 2018-06-19 NOTE — Discharge Summary (Signed)
Family Medicine Teaching [facility] Discharge Summary  Patient name: Alfred Kelley Medical record number: [id number] Date of birth: 1956-01-02 Age: 62 y.o. Gender: male Date of Admission: 06/17/2018  Date of Discharge: 06/30/2018 Admitting Physician: Doreene Eland, MD  Primary Care Provider: Patient, No Pcp Per Consultants: GI, PT  Indication for Hospitalization: Acute Liver Failure 2/2 Alcohol Withdrawal, hematuria  Discharge Diagnoses/Problem List:  Acute liver failure 2/2 alcohol abuse Encephalopathy  Hypertension Alcohol use disorder Hyperlipidemia  Disposition: Discharge SNF  Discharge Condition: Stable  Discharge Exam:  Physical Exam:  Gen: NAD, alert, non-toxic, well-appearing, sitting comfortably in chair.  Skin: Warm and dry. No obvious rashes, lesions, or trauma. HEENT: NCAT No conjunctival pallor or injection. MMM.  CV: Regular and tachycardic.  Normal S1-S2. RP 2+ bilaterally. No BLEE. Resp: CTAB. No increased WOB Abd: Positive bowel sounds. Abdomen moderately firm with distention. No bulging flanks. Positive shifting dullness. Psych: Cooperative with exam. Pleasant. Makes eye contact. Improved fluency and mentation. Psychomotor slowing and tremor nearly resolved.  Extremities: Moves all extremities spontaneously.  No tremor appreciated today.  No asterixis appreciated.   Neuro: CN II-XII grossly intact. No FNDs.   Brief Hospital Course:  Alfred Kelley is a 62 year old male who was admitted for abdominal pain, nauseas, vomiting, and hematuria concerning for Alcohol Withdrawal and Acute Liver Failure secondary to alcohol use disorder. He was placed on CIWA protocol and noted to have scores as high as 18. The patient did not seize during his withdrawal period but was noted to have delirium tremens with hallucinations and tremulousness. Patient was also started on rifaximin 550mg  BID and lactulose 10g daily. He was also started on multivitamins, folate, and  thiamine. By the day of discharge, patient was improved cognitively with more fluent speech and mentation, improvement of tremor and without emotional lability. Additionally, he did not require ativan for 48 hours prior to discharge.  Patient's LFTs continued to improve during his admission. Prior to discharge, his AST was 74 ALT wnl, T bili 4.7 and albumin 1.6. Gastroenterology consulted and followed patient through his admission. Patient's atorvastatin was discontinued while inpatient due to his liver failure. It was not restarted as the benefit of primary prevention did not outweigh the risk of worsening liver injury.  Patient also had abdominal distention during his admission. He reports attempting to make himself vomit in order to relieve abdominal fullness. Patient had paracentesis and 1 L of taken off after positive findings on ascites ultrasound. Additionally, patient had several large bowel movements which helped relieve some of his fullness. The lab findings for paracentesis fluid were not completed prior to discharge.  Patient was also noted to have bilateral crackles on exam on 06/27/18. He was taking 20 mg of lasix and 50mg  Spironolactone for hypertension. Crackles resolved, however patient continued to have positive shifting dullness. Diuresis was ultimately switched to and discharged on 10 mg torsemide BID w/ 50 mg spironolactone BID to help with volume overload and asictes. Patient denied chest pain. DOE was not well established as patient 2/2 patient's mental status and fall precautions. Though he tolerated PT well.  Prior to discharge, patient's labs were obtained for trending purposes. CBC, BMP, INR were consistent with prior labs. There was resolution of transaminitis, with continued macrocytic anemia (hemoglobin stable), and mild hyponatremia (chronic). Other medical issues: In terms of hypertension, patient's lisinopril was discontinued on admission due to hematuria. His blood pressures  remained stable. He will restart lisinopril at discharge.  Patient was found to  have macrocytic anemia with thrombocytopenia likely secondary to alcohol related acute liver failure. Hemoglobin was stable ~9-10 and MCV was 108.3 prior to discharge. Patient will need follow up CBC and INR.  Other hospital problems included hyponatremia, hypokalemia and hematuria, all of which were treated and stabilized during hospitalization.   Issues for Follow Up:  1. Lactulose titration to 2-3 BM per day. 2. Restart atorvastatin for HLD primary prevention, disconinued for acute liver failure.  3. Check INR, CBC in one week after discharge 4. D/C'ed lisinopril in setting of normal BP while lower than baseline, likely due to liver disease. Can consider adding back on with liver recovery.  5. Switched to PO 20mg  Torsemide BID (+100spirono BID) for diuresis of ascites 6. If diuresis fails, consider therapeutic paracentesis. 7. Follow up with Dr. Almyra Free in 2-3 weeks after SNF   Significant Procedures:  Paracentesis  Significant Labs and Imaging:  Recent Labs  Lab 06/27/18 0329 06/30/18 0751  WBC 6.7 9.9  HGB 9.3* 10.9*  HCT 27.4* 31.9*  PLT 129* 158   Recent Labs  Lab 06/25/18 0503 06/27/18 0329 06/30/18 0751  NA 138 137 134*  K 3.5 3.5 3.5  CL 105 103 96*  CO2 29 29 30   GLUCOSE 119* 133* 115*  BUN 6 8 8   CREATININE 0.83 0.75 0.82  CALCIUM 8.1* 7.7* 8.1*  ALKPHOS  --  57 61  AST  --  74* 79*  ALT  --  39 41  ALBUMIN  --  1.6* 2.1*    Results/Tests Pending at Time of Discharge: none  Discharge Medications:  Allergies as of 06/30/2018   No Known Allergies     Medication List    STOP taking these medications   lisinopril-hydrochlorothiazide 10-12.5 MG tablet Commonly known as:  PRINZIDE,ZESTORETIC   simvastatin 40 MG tablet Commonly known as:  ZOCOR     TAKE these medications   feeding supplement (ENSURE ENLIVE) Liqd Take 237 mLs by mouth 2 (two) times daily between  meals.   folic acid 1 MG tablet Commonly known as:  FOLVITE Take 1 tablet (1 mg total) by mouth daily.   lactulose 10 GM/15ML solution Commonly known as:  CHRONULAC Take 30 mLs (20 g total) by mouth 2 (two) times daily.   multivitamin with minerals Tabs tablet Take 1 tablet by mouth daily.   rifaximin 550 MG Tabs tablet Commonly known as:  XIFAXAN Take 1 tablet (550 mg total) by mouth 2 (two) times daily.   spironolactone 100 MG tablet Commonly known as:  ALDACTONE Take 1 tablet (100 mg total) by mouth 2 (two) times daily.   thiamine 100 MG tablet Take 1 tablet (100 mg total) by mouth daily.   torsemide 20 MG tablet Commonly known as:  DEMADEX Take 1 tablet (20 mg total) by mouth 2 (two) times daily.       Discharge Instructions: Please refer to Patient Instructions section of EMR for full details.  Patient was counseled important signs and symptoms that should prompt return to medical care, changes in medications, dietary instructions, activity restrictions, and follow up appointments.   Follow-Up Appointments:  Contact information for follow-up providers    [facility]. Go on 07/05/2018.   Specialty:  Internal Medicine Why:  1:00 pm Contact information: [address] [place] [address] [phone]           Contact information for after-discharge care    Destination    [facility] Preferred  SNF .   Service:  Skilled Nursing Contact information: [address] [place] [address] [phone]                  Future Appointments  Date Time Provider Department Center  07/05/2018  1:00 PM Kallie Locks, FNP [facility] None    Leeroy Bock, DO 06/30/2018, 2:08 PM PGY-1, [facility]

## 2018-06-20 LAB — CBC
HCT: 27.1 % — ABNORMAL LOW (ref 39.0–52.0)
Hemoglobin: 9.2 g/dL — ABNORMAL LOW (ref 13.0–17.0)
MCH: 35.4 pg — AB (ref 26.0–34.0)
MCHC: 33.9 g/dL (ref 30.0–36.0)
MCV: 104.2 fL — ABNORMAL HIGH (ref 80.0–100.0)
PLATELETS: 109 10*3/uL — AB (ref 150–400)
RBC: 2.6 MIL/uL — AB (ref 4.22–5.81)
RDW: 15.9 % — AB (ref 11.5–15.5)
WBC: 10.3 10*3/uL (ref 4.0–10.5)
nRBC: 0 % (ref 0.0–0.2)

## 2018-06-20 LAB — COMPREHENSIVE METABOLIC PANEL
ALT: 57 U/L — AB (ref 0–44)
AST: 104 U/L — ABNORMAL HIGH (ref 15–41)
Albumin: 1.9 g/dL — ABNORMAL LOW (ref 3.5–5.0)
Alkaline Phosphatase: 62 U/L (ref 38–126)
Anion gap: 7 (ref 5–15)
BUN: 6 mg/dL (ref 6–20)
CHLORIDE: 96 mmol/L — AB (ref 98–111)
CO2: 30 mmol/L (ref 22–32)
CREATININE: 0.67 mg/dL (ref 0.61–1.24)
Calcium: 8.4 mg/dL — ABNORMAL LOW (ref 8.9–10.3)
Glucose, Bld: 108 mg/dL — ABNORMAL HIGH (ref 70–99)
Potassium: 3.6 mmol/L (ref 3.5–5.1)
Sodium: 133 mmol/L — ABNORMAL LOW (ref 135–145)
Total Bilirubin: 7.4 mg/dL — ABNORMAL HIGH (ref 0.3–1.2)
Total Protein: 6.9 g/dL (ref 6.5–8.1)

## 2018-06-20 LAB — PROTIME-INR
INR: 1.95
PROTHROMBIN TIME: 22 s — AB (ref 11.4–15.2)

## 2018-06-20 LAB — ANTINUCLEAR ANTIBODIES, IFA: ANTINUCLEAR ANTIBODIES, IFA: NEGATIVE

## 2018-06-20 MED ORDER — LORAZEPAM 1 MG PO TABS: 1.0000 mg | ORAL_TABLET | Freq: Four times a day (QID) | ORAL | Status: DC | PRN

## 2018-06-20 MED ORDER — SODIUM CHLORIDE 0.9 % IV BOLUS
500.0000 mL | Freq: Once | INTRAVENOUS | Status: AC
  Administered 2018-06-20: 500 mL via INTRAVENOUS

## 2018-06-20 MED ORDER — LORAZEPAM 2 MG/ML IJ SOLN: 1.0000 mg | Freq: Four times a day (QID) | INTRAMUSCULAR | Status: DC | PRN

## 2018-06-20 NOTE — Progress Notes (Signed)
Pt arrived to unit and transferred to bed. Identified appropriately and assessed. Vital signs WNL. Pt confused, but alert and able to answer questions. Bed alarm on, mats placed on floor for safety. [employer] in room. Call bell placed within reach and pt instructed to call before ambulating. Will continue to monitor.

## 2018-06-20 NOTE — Progress Notes (Signed)
Patient's brother, Thierno Hun, was called and notified of the patient's room change.

## 2018-06-20 NOTE — Progress Notes (Addendum)
Family Medicine Teaching Service Daily Progress Note Intern Pager: [phone]  Patient name: Alfred Kelley Medical record number: [id number] Date of birth: Oct 19, 1955 Age: 62 y.o. Gender: male  Primary Care Provider: Patient, No Pcp Per Consultants: GI, PT Code Status: Full  Pt Overview and Major Events to Date:  11/1 admitted foracute liver failure, alcohol withdrawal  Assessment and Plan: Alfred Kelley is a 62 yo malepresenting with jaundice, abdominal pain/bloating,confusion.PMH is significant foressential hypertension, alcohol use disorder.  Acute Liver Failure:presented with confusion, scleral icterus, abdominal bloating. Extensive alcohol use history, bilirubin 12.8, synthetic liver dysfunction with an INR of 1.89 w/o anticoagulation. He does report a history of questionable hepatitis several years ago associated with foodborne mechanism,? Hepatitis A. Acute liver failure defined by platelets <150K, INR >1.5, elevated LFTs, elevated bili. Maddrey Score 48.2(11/2) > 38.1 (11/3) > 48.8 (11/4). -Consult GI, appreciate recs -Recheck daily Maddrey/Discriminant Function Score -Monitor mental status closely -Limit total Tylenol dose to 2g/24 hours - Plan to keep in hospital until symptom free from withdrawal for ~48 hours.   Alcohol use disorder:history of the same with recent relapse secondary to stress. Potential etiology of his acute liver failure. Will use CIWAjudiciously as benzodiazepines can accumulate in patients with liver dysfunction. Folic acid wnl. CIWA 27>24>23>10 (11/4). Patient received 3mg  Lorazepam at 0136 and 0344 on 11/4. - CIWA protocol - limiting ativan to 1mg /dose at this time - closely monitor MS - vitamin B1  Hematuria: h/o frequent nephrolithiasis. CT abdomen showed punctate left nonobstructing stone. Urinalysis revealed >50 RBC, >50WBC, rare bacteria, other tests not reported due to red color interference. Patient denies any pain with  urination at any time. He states that he has had 4 days of pink urine last week. This week his urine has been cranberry-colored. He denies smoking history. -consult urology, appreciate recs: Discussed with Dr. Mena Goes - microscopic hematuria likely exacerbated by bili from liver failure, recommend outpatient cystoscopy. Reconsult if problems voiding or worsening.  Hypertension, stable:126/63 (11/4), history of the same - hold home lisinopril-hydrochlorothiazide 10-12.5mg  - vitals per floor protocol  Hyponatremia, improving:Na+ 126 on admission > 128 > 130 > 133 (11/4). Likely 2/2 fluid shifts with moderate ascites and acute liver failure. - CMPdaily - PO fluid restriction - monitor I/O's - daily weights  Hypokalemia, improving, stable:K+ 3.3 > 3.0 > 3.4 > 3.6 (11/4) after repletion with Kdur.  - CMPdaily  Macrocytic Anemia, stable:Hgb 11.9 > 10.6 >9.2 (11/4), MCV 101.8. Likely multifactorial, will assess with anemia panel.  INR 1.89, reticulocyte count 2.3%, folate within normal limits. Vitamin B12, ferritin, iron and TIBC pending. -Follow-up remaining anemia panel - alcohol cessation counseling  Thrombocytopenia:platelets 118> 108, likely 2/2 acute liver failure. Consider d/c lovenox if plts continue to decrease to <50K.Platelets stable at 119 > 109 11/4. - continue to monitor for signs of bleeding  Hyperlipidemia:takes simvastatin at home. No lipid panel in chart review. - holding simvastatin 40mg  daily - lipid panel ordered  Moderate protein malnutrition: albumin 2.3. Patient has abdominal bloating, negative fluid wave, no lower extremity swelling. - nutrition consult - continue to monitor  GERD: patient states that he takes tums frequently at home for abdominal pain that burns and is relieved with tums and vomiting. -TUMS PRN  FEN/GI:heart healthy with fluid restriction GUY:[id number]  Disposition:continued medical workup for acute liver  failure  Subjective:  Episode of hallucination over night trying to get out of bed, was assigned sitter. He is asleep this morning as he had 6mg  total of Ativan  overnight. No reports of nausea, vomiting, diarrhea, abdominal pain. CIWAs ~23.  Objective: Temp:  [98.1 F (36.7 C)-98.8 F (37.1 C)] 98.1 F (36.7 C) (11/04 0340) Pulse Rate:  [110-122] 115 (11/04 0340) Resp:  [17-28] 28 (11/03 1111) BP: (113-149)/(63-101) 126/73 (11/04 0340) SpO2:  [93 %-100 %] 96 % (11/04 0340)  Physical Exam  Constitutional: He is oriented to person, place, and time. He appears well-developed and well-nourished. No distress.  HENT:  Head: Normocephalic.  Eyes: EOM are normal. Scleral icterus is present.  Cardiovascular: Regular rhythm.  Tachycardic to 110.  Pulmonary/Chest: Effort normal and breath sounds normal.  Abdominal: He exhibits distension. He exhibits no pulsatile liver. Bowel sounds are increased. There is no tenderness. No hernia.  Neurological: He is alert and oriented to person, place, and time.  Cranial nerves II through XII grossly intact, asterixis; 5/5 strength in bilateral upper and lower extremities with normal coordination; resting tremor.  Skin: Skin is warm and dry.  Psychiatric: He has a normal mood and affect.   Laboratory: Recent Labs  Lab 06/18/18 0427 06/19/18 0557 06/20/18 0341  WBC 9.9 11.2* 10.3  HGB 10.6* 10.6* 9.2*  HCT 29.3* 30.5* 27.1*  PLT 103* 119* 109*   Recent Labs  Lab 06/18/18 0427 06/18/18 0959 06/19/18 0557 06/20/18 0341  NA 128*  --  130* 133*  K 3.0*  --  3.4* 3.6  CL 93*  --  96* 96*  CO2 30  --  28 30  BUN 9  --  9 6  CREATININE 0.81  --  0.88 0.67  CALCIUM 8.6*  --  8.5* 8.4*  PROT 6.8  --  7.8 6.9  BILITOT 10.5* 10.1* 9.1* 7.4*  ALKPHOS 57  --  76 62  ALT 66*  --  67* 57*  AST 113*  --  118* 104*  GLUCOSE 144*  --  129* 108*   Folate (11/3): 9.9 WNL Vitamin B12: 1172 elevated Ammonia: 31 > 44. TSH (11/2): 2.327 WNL Antinuclear  antibodies: Pending Alpha-1 antitrypsin: 121 normal INR: 1.98 > 2.12 > 1.89 > 1.95 (11/4) (Albumin 1.9) Retic Count: 2.3 wnl Ferritin: Elevated at 3,376 Iron: 103 normal TIBC: not calculated Hepatitis A, B, C (11/2): Negative, within normal limits HIV (11/2): Nonreactive GGT (11/2): 80 elevated  Imaging/Diagnostic Tests: Dg Chest 1 View  Result Date: 06/17/2018 CLINICAL DATA:  Tachycardia.  Possible alcohol withdrawal. EXAM: CHEST  1 VIEW COMPARISON:  None. FINDINGS: The heart size and mediastinal contours are within normal limits. Both lungs are clear. The visualized skeletal structures are unremarkable. IMPRESSION: Clear lungs. Electronically Signed   By: Deatra Robinson M.D.   On: 06/17/2018 22:16   Ct Abdomen Pelvis W Contrast  Result Date: 06/17/2018 CLINICAL DATA:  62 y/o  M; acute generalized abdominal pain. EXAM: CT ABDOMEN AND PELVIS WITH CONTRAST TECHNIQUE: Multidetector CT imaging of the abdomen and pelvis was performed using the standard protocol following bolus administration of intravenous contrast. CONTRAST:  100 cc Omnipaque 300 COMPARISON:  None. FINDINGS: Lower chest: No acute abnormality. Hepatobiliary: Decreased attenuation in diffuse heterogeneity of the liver. Recanalization of umbilical vein and mesenteric collateralization. No focal liver lesion. Normal appearance of the gallbladder. No biliary ductal dilatation. Pancreas: Unremarkable. No pancreatic ductal dilatation or surrounding inflammatory changes. Spleen: Normal in size without focal abnormality. Adrenals/Urinary Tract: Adrenal glands are unremarkable. Punctate left kidney interpolar nonobstructing stone. Ureter stone or hydronephrosis. Normal bladder. Stomach/Bowel: Stomach is within normal limits. Appendix appears normal. No evidence of bowel wall thickening,  distention, or inflammatory changes. Vascular/Lymphatic: Aortic atherosclerosis. No enlarged abdominal or pelvic lymph nodes. Reproductive: Prostate is  unremarkable. Other: Small volume of ascites. Musculoskeletal: No fracture is seen. IMPRESSION: 1. Heterogeneity of the liver parenchyma, small volume ascites, recanalization of the umbilical vein, and mesenteric collaterals. 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 2.  Aortic Atherosclerosis (ICD10-I70.0). 3. Punctate left kidney nonobstructing stone. Electronically Signed   By: Mitzi Hansen M.D.   On: 06/17/2018 22:50    Dollene Cleveland, DO 06/20/2018, 7:37 AM PGY-1,  Family Medicine FPTS Intern pager: [phone], text pages welcome

## 2018-06-20 NOTE — Progress Notes (Signed)
GASTROENTEROLOGY PROGRESS NOTE  Problem:   Acute alcoholic hepatitis. Altered mental status.   Chronic alcohol abuse.  Hematuria/microhematuria  Subjective: Alfred Kelley obtunded following Ativan sedation for hallucinations and altered mental status last night.  Mother, brother Jonny Ruiz) and daughter are at bedside.  Alfred Kelley lives in [place] and is an extremely successful businessman, in the field of Nurse, learning disability.  His brother, who is a Education officer, environmental plans to take him in following discharge, to facilitate alcohol abstinence.  His mother, who lives in this area, was unaware of his alcohol abuse.  His daughter says she witnessed it personally for the past 23 years.  Objective: CT without evidence of cirrhosis, just minimal ascites, no GU neoplasia to account for hematuria.  Alfred Kelley's urine on admission was described as "red" in color on the official urinalysis, and had more than 50 red cells per high-powered field.  Minimal ascites, no splenomegaly.   Blood work shows a rise in INR from 1.89 to a current level of 1.95 over past 24 hours, but essentially unchanged from admission 3 days ago.  Transaminases and bilirubin are progressively trending downward.  Hepatology serologies negative.  Ferritin elevated consistent with acute liver inflammation, but serum iron normal at 103.  Current discriminant function is approximately 44 and current meld score is 21.  At present, Alfred Kelley is obtunded, arousable to voice and tactile stimulation, essentially nonverbal.  No distress lying in bed.   Assessment: Improving alcoholic hepatitis.  Doubt advanced fibrosis based on radiographic appearance of non-scalloped liver and a normal-sized spleen, although with thrombocytopenia underlying cirrhosis would be possible.  Altered mental status presumably a consequence of alcohol withdrawal syndrome and associated sedative medications for its treatment.  Doubt clinically significant encephalopathy with recent  ammonia level of 44.  Plan: Today's visit lasted approximately 40 minutes at the bedside, and more than 50% that was spent on counseling and educating the family regarding natural history of alcohol related liver disease.  We did discuss the option of initiation of prednisolone but in my opinion, since the Alfred Kelley's chemistries are improving without it, and since that medication is of very uncertain benefit, I have recommended observation rather than medical therapy, and the Alfred Kelley's family are agreeable to this course of action.  I also considered pentoxifylline but since the Alfred Kelley's renal function is normal and this medication is of uncertain benefit especially in patients with normal renal function, I have decided to hold off on that as well.  We will update lab work tomorrow to monitor the trend of his liver, and I would favor continuation of supportive care without steroids at this time, see above discussion.  Florencia Reasons, M.D. 06/20/2018 12:08 PM  Pager [phone] If no answer or after 5 PM call [phone]

## 2018-06-20 NOTE — Progress Notes (Addendum)
Pt was sleeping, woke up very impulsive and confused, trying to walk away. Hallucinating (visual), pt stated he is in space ship and killing some shit underwater, pt was saying " look around the ocean, I have to finish another half, this is just begining". Pt very unsteady , 2 person assist. PRN ativan administered per CIWA protocol. Pt is either resting after ativan or wakes up very impulsive and confused.called on call MD and notified of situation, and asked if we could have restrain. MD stated he can get 1mg  extra Ativan prn if he needs it, and ordered 1:1 safety sitter. But no sitter is available at this time. Pt currently calm, vitals stable. Bed alarm on. Will continue to monitor.

## 2018-06-21 LAB — COMPREHENSIVE METABOLIC PANEL
ALK PHOS: 63 U/L (ref 38–126)
ALT: 56 U/L — AB (ref 0–44)
AST: 104 U/L — AB (ref 15–41)
Albumin: 1.9 g/dL — ABNORMAL LOW (ref 3.5–5.0)
Anion gap: 6 (ref 5–15)
BUN: 8 mg/dL (ref 6–20)
CALCIUM: 8.1 mg/dL — AB (ref 8.9–10.3)
CHLORIDE: 98 mmol/L (ref 98–111)
CO2: 29 mmol/L (ref 22–32)
Creatinine, Ser: 0.77 mg/dL (ref 0.61–1.24)
Glucose, Bld: 123 mg/dL — ABNORMAL HIGH (ref 70–99)
Potassium: 3.6 mmol/L (ref 3.5–5.1)
Sodium: 133 mmol/L — ABNORMAL LOW (ref 135–145)
Total Bilirubin: 6.7 mg/dL — ABNORMAL HIGH (ref 0.3–1.2)
Total Protein: 7.1 g/dL (ref 6.5–8.1)

## 2018-06-21 LAB — CBC
HCT: 28.8 % — ABNORMAL LOW (ref 39.0–52.0)
Hemoglobin: 9.6 g/dL — ABNORMAL LOW (ref 13.0–17.0)
MCH: 35 pg — AB (ref 26.0–34.0)
MCHC: 33.3 g/dL (ref 30.0–36.0)
MCV: 105.1 fL — AB (ref 80.0–100.0)
NRBC: 0 % (ref 0.0–0.2)
Platelets: 120 10*3/uL — ABNORMAL LOW (ref 150–400)
RBC: 2.74 MIL/uL — AB (ref 4.22–5.81)
RDW: 16.4 % — ABNORMAL HIGH (ref 11.5–15.5)
WBC: 10.4 10*3/uL (ref 4.0–10.5)

## 2018-06-21 LAB — PROTIME-INR
INR: 1.96
PROTHROMBIN TIME: 22.1 s — AB (ref 11.4–15.2)

## 2018-06-21 LAB — AMMONIA: AMMONIA: 48 umol/L — AB (ref 9–35)

## 2018-06-21 MED ORDER — LORAZEPAM 2 MG/ML IJ SOLN
2.0000 mg | INTRAMUSCULAR | Status: DC | PRN
  Administered 2018-06-22 – 2018-06-25 (×10): 2 mg via INTRAVENOUS
  Filled 2018-06-21 (×13): qty 1

## 2018-06-21 MED ORDER — LORAZEPAM 1 MG PO TABS: 2.0000 mg | ORAL_TABLET | Freq: Four times a day (QID) | ORAL | Status: DC | PRN

## 2018-06-21 MED ORDER — LORAZEPAM 1 MG PO TABS
2.0000 mg | ORAL_TABLET | ORAL | Status: DC | PRN
  Administered 2018-06-21 – 2018-06-23 (×6): 2 mg via ORAL
  Filled 2018-06-21 (×4): qty 2

## 2018-06-21 MED ORDER — RIFAXIMIN 550 MG PO TABS
550.0000 mg | ORAL_TABLET | Freq: Two times a day (BID) | ORAL | Status: DC
  Administered 2018-06-21 – 2018-06-30 (×18): 550 mg via ORAL
  Filled 2018-06-21 (×19): qty 1

## 2018-06-21 MED ORDER — LORAZEPAM 2 MG/ML IJ SOLN
2.0000 mg | Freq: Four times a day (QID) | INTRAMUSCULAR | Status: DC | PRN
  Administered 2018-06-21: 2 mg via INTRAVENOUS
  Filled 2018-06-21: qty 1

## 2018-06-21 NOTE — Progress Notes (Signed)
Current CIWA score is a 0 as patient is sleeping.

## 2018-06-21 NOTE — Progress Notes (Signed)
Bilirubin slightly improved.  Other labs essentially stable.  Patient more alert, follows commands, but still jittery and basically nonverbal.    Mild asterixis present. Despite near-normal ammonia level, will add rifaximin to patient's regimen empirically.  Florencia Reasons, M.D. Pager [phone] If no answer or after 5 PM call [phone]

## 2018-06-21 NOTE — Progress Notes (Signed)
Physical Therapy Treatment Patient Details Name: Alfred Kelley MRN: [id number] DOB: 07-Dec-1955 Today's Date: 06/21/2018    History of Present Illness 62 y.o. male with past medical history of alcohol abuse brought into the hospital by brother for evaluation of jaundice and confusion yesterday.  Upon initial evaluation, he was found to have jaundice with total bilirubin of 12.8, AST 154, ALT 85, normal alkaline phosphatase.  INR 1.98.  CBC showed mild leukocytosis with WBC 13.0,  hemoglobin of 11.9 and platelet count of 118.  CT abdomen pelvis with IV contrast showed heterogenicity of the liver parenchyma and small volume ascites with differential of cirrhosis and possible concurrent acute hepatitis.    PT Comments    Pt on commode and NT found PTA in hall and asked for assistance to transfer patient from commode.  Pt presents with lethargy and slow initiation of movement with increased time to follow commands.  PTA used sara stedy as patient had difficulty placing his feet in platform of sara stedy.  He required assistance to lift feet onto plate against gravity.  Pt requiring increased assistance and will benefit from f/u from supervising PT to ensure follow up recommendations are accurate.      Follow Up Recommendations  No PT follow up;Supervision - Intermittent     Equipment Recommendations  Other (comment)(TBD)    Recommendations for Other Services       Precautions / Restrictions Precautions Precautions: Fall Restrictions Weight Bearing Restrictions: No    Mobility  Bed Mobility               General bed mobility comments: Pt transferred from stedy to seated edge of bed with two NT presents to assist back to bed.    Transfers Overall transfer level: Needs assistance Equipment used: Ambulation equipment used(stedy used for transfer into standing from low seated commode.  ) Transfers: Sit to/from Stand Sit to Stand: Mod assist(from low seated commode. )          General transfer comment: Pt required assistance to boost into standing.  Presents with excessive hip flexion require cues and facilitation to achieve hip extension for placement of stedy plates.  Pt slow to initiate movement into standing from low seated commode.    Ambulation/Gait Ambulation/Gait assistance: (NT)               Stairs             Wheelchair Mobility    Modified Rankin (Stroke Patients Only)       Balance Overall balance assessment: Needs assistance Sitting-balance support: No upper extremity supported;Feet supported Sitting balance-Leahy Scale: Poor       Standing balance-Leahy Scale: Poor                              Cognition Arousal/Alertness: Awake/alert Behavior During Therapy: Flat affect Overall Cognitive Status: Impaired/Different from baseline(not formally assessed as entered room to assist with back to bed transfer.  )                                        Exercises      General Comments        Pertinent Vitals/Pain Pain Assessment: No/denies pain    Home Living  Prior Function            PT Goals (current goals can now be found in the care plan section) Acute Rehab PT Goals Patient Stated Goal: not stated Potential to Achieve Goals: Good Progress towards PT goals: Progressing toward goals    Frequency    Min 3X/week      PT Plan Current plan remains appropriate    Co-evaluation              AM-PAC PT "6 Clicks" Daily Activity  Outcome Measure  Difficulty turning over in bed (including adjusting bedclothes, sheets and blankets)?: A Little Difficulty moving from lying on back to sitting on the side of the bed? : A Little Difficulty sitting down on and standing up from a chair with arms (e.g., wheelchair, bedside commode, etc,.)?: A Lot Help needed moving to and from a bed to chair (including a wheelchair)?: A Little Help needed walking in  hospital room?: A Little Help needed climbing 3-5 steps with a railing? : A Lot 6 Click Score: 16    End of Session Equipment Utilized During Treatment: Gait belt Activity Tolerance: Patient limited by fatigue Patient left: in bed;with call bell/phone within reach;with family/visitor present;with bed alarm set Nurse Communication: Mobility status PT Visit Diagnosis: Unsteadiness on feet (R26.81)     Time: 4098-1191 PT Time Calculation (min) (ACUTE ONLY): 10 min  Charges:  $Therapeutic Activity: 8-22 mins                     Joycelyn Rua, PTA Acute Rehabilitation Services Pager [phone] Office [phone]     Alfred Kelley Artis Delay 06/21/2018, 5:23 PM

## 2018-06-21 NOTE — Progress Notes (Signed)
Family Medicine Teaching Service Daily Progress Note Intern Pager: [phone]  Patient name: Alfred Kelley Medical record number: [id number] Date of birth: 1955/09/25 Age: 62 y.o. Gender: male  Primary Care Provider: Patient, No Pcp Per Consultants:GI, PT Code Status:Full  Pt Overview and Major Events to Date: 11/1 admitted foracute liver failure, alcohol withdrawal  Assessment and Plan: Alfred Kelley is a 62 yo malepresenting with jaundice, abdominal pain/bloating,confusion.PMH is significant foressential hypertension, alcohol use disorder.  Acute Liver Failure:presentedwith confusion, scleral icterus, abdominal bloating. Extensive alcohol use history,bilirubin 12.8, synthetic liver dysfunction with an INR of 1.89 w/oanticoagulation. He does report a history of questionable hepatitis several years ago associated with foodborne mechanism,? Hepatitis A. Acute liver failure defined by platelets <150K, INR >1.5, elevated LFTs, elevated bili.Maddrey Score48.2(11/2) > 38.1 (11/3) > 48.8 (11/4). -Consult GI, appreciate recs -Recheck daily Maddrey/Discriminant Function Score -Monitor mental status closely -Limit totalTylenoldose to 2g/24 hours - Plan to keep in hospital until symptom free from withdrawal for ~48 hours. - Will plan to add Rifaximin or Lactulose for potential hepatic encephalopathy.  Alcohol use disorder:history of the same with recent relapse secondary to stress. Potential etiology of his acute liver failure. Will use CIWAjudiciously as benzodiazepines can accumulate in patients with liver dysfunction.Folic acid wnl. CIWA 10>10>12 overnight 11/5. Patient received Lorazepam 1mg  1905, 1mg  2138, 2mg  0159, 2mg  0502, and 2mg  0657 (11/5). - CIWA protocol - limiting ativan to 2mg /dose at this time - closely monitor MS - vitamin B1  Hypertension, stable:141/82 (11/5), history of the same  - hold home lisinopril-hydrochlorothiazide 10-12.5mg  - vitals  per floor protocol  Hyponatremia, improving:Na+ 126 on admission > 128 > 130 > 133 > 133 (11/5). Likely 2/2 fluid shifts with moderate ascites and acute liver failure. - CMPdaily - PO fluid restriction - monitor I/O's - daily weights  Hypokalemia, improving, stable:K+ 3.3 >3.0 >3.4> 3.6 > 3.6 (11/5)afterrepletion withKdur.  - CMPdaily  Macrocytic Anemia, stable:Hgb 11.9> 10.6 >9.2>9.6 (11/5), MCV 101.8. Likely multifactorial, will assess with anemia panel.INR 1.89, reticulocyte count 2.3%, folate within normal limits. Vitamin B12 elevated, ferritin elevated, and iron normal. -Follow-up remaininganemia panel - alcohol cessation counseling  Thrombocytopenia:platelets 118>108, likely 2/2 acute liver failure. Consider d/c lovenox if plts continue to decrease to <50K.Platelets stable at 119 > 109 11/4. - continue to monitor for signs of bleeding  Hyperlipidemia:takes simvastatin at home. No lipid panel in chart review. - holding simvastatin 40mg  daily - lipid panelordered  Moderate protein malnutrition:albumin 2.3 > 2.1 > 1.9. Patient has abdominal bloating, negative fluid wave, no lower extremity swelling. - continue to monitor  Hematuria, improved, resolved:h/o frequent nephrolithiasis. CT abdomen showed punctate left nonobstructing stone. Urinalysis revealed >50 RBC, >50WBC, rare bacteria, other tests not reported due to red color interference. Patient denies any pain with urination at any time. He states that he has had 4 days of pink urine last week. This week his urine has been cranberry-colored. He denies smoking history. -consult urology, appreciate recs: Discussed with Dr. Mena Goes - microscopic hematuria likely exacerbated by bili from liver failure, recommend outpatient cystoscopy. Reconsult if problems voiding or worsening.  GERD:patient states that he takes tums frequently at home for abdominal pain that burns and is relieved with tums and  vomiting. -TUMS PRN  FEN/GI:heart healthy with fluid restriction JYN:WGNFAOZ  Disposition:can discharge home once he has been symptom free for 48 hours.  Subjective:  Patient seen this morning resting in bed. He is sluggish this morning and appears very sleepy, but is more alert than  previously. He is tremulous and very difficult to understand. He is not following commands. Otherwise, he is unchanged and denies nausea, vomiting, diarrhea, shortness of breath, chest pain, and headaches.  Objective: Temp:  [97.5 F (36.4 C)-98.8 F (37.1 C)] 97.5 F (36.4 C) (11/05 0431) Pulse Rate:  [99-118] 115 (11/05 0431) Resp:  [18-24] 18 (11/05 0431) BP: (116-141)/(74-82) 141/82 (11/05 0431) SpO2:  [95 %-98 %] 96 % (11/05 0431) Physical Exam  Constitutional: He is oriented to person, place, and time. He appears well-developed and well-nourished. No distress.  HENT:  Head: Normocephalic.  Eyes: EOM are normal. Scleral icterus is present.  Cardiovascular: Regular rhythm.  Tachycardic to 110.  Pulmonary/Chest: Effort normal and breath sounds normal.  Abdominal: He exhibits distension. He exhibits no pulsatile liver. Bowel sounds are increased. There is no tenderness. No hernia.  Neurological: He is alert and oriented to person, place, and time.  Cranial nerves II through XII grossly intact, asterixis; 5/5 strength in bilateral upper and lower extremities with normal coordination; resting tremor.  Skin: Skin is warm and dry.  Psychiatric: He has a normal mood and affect.   Laboratory: Recent Labs  Lab 06/19/18 0557 06/20/18 0341 06/21/18 0245  WBC 11.2* 10.3 10.4  HGB 10.6* 9.2* 9.6*  HCT 30.5* 27.1* 28.8*  PLT 119* 109* 120*   Recent Labs  Lab 06/19/18 0557 06/20/18 0341 06/21/18 0245  NA 130* 133* 133*  K 3.4* 3.6 3.6  CL 96* 96* 98  CO2 28 30 29   BUN 9 6 8   CREATININE 0.88 0.67 0.77  CALCIUM 8.5* 8.4* 8.1*  PROT 7.8 6.9 7.1  BILITOT 9.1* 7.4* 6.7*  ALKPHOS 76 62 63   ALT 67* 57* 56*  AST 118* 104* 104*  GLUCOSE 129* 108* 123*   Folate (11/3): 9.9 WNL Vitamin B12: 1172 elevated Ammonia: 31 > 44. TSH (11/2): 2.327 WNL Antinuclear antibodies: negative (wnl) Alpha-1 antitrypsin: 121 normal INR: 1.98>2.12 >1.89 > 1.95 (11/4) (Albumin 1.9) Retic Count: 2.3 wnl Ferritin: Elevated at 3,376 Iron: 103 normal TIBC: not calculated Hepatitis A, B, C (11/2): Negative, within normal limits HIV (11/2): Nonreactive GGT (11/2): 80 elevated  Imaging/Diagnostic Tests: Dg Chest 1 View  Result Date: 06/17/2018 CLINICAL DATA:  Tachycardia.  Possible alcohol withdrawal. EXAM: CHEST  1 VIEW COMPARISON:  None. FINDINGS: The heart size and mediastinal contours are within normal limits. Both lungs are clear. The visualized skeletal structures are unremarkable. IMPRESSION: Clear lungs. Electronically Signed   By: Deatra Robinson M.D.   On: 06/17/2018 22:16   Ct Abdomen Pelvis W Contrast  Result Date: 06/17/2018 CLINICAL DATA:  62 y/o  M; acute generalized abdominal pain. EXAM: CT ABDOMEN AND PELVIS WITH CONTRAST TECHNIQUE: Multidetector CT imaging of the abdomen and pelvis was performed using the standard protocol following bolus administration of intravenous contrast. CONTRAST:  100 cc Omnipaque 300 COMPARISON:  None. FINDINGS: Lower chest: No acute abnormality. Hepatobiliary: Decreased attenuation in diffuse heterogeneity of the liver. Recanalization of umbilical vein and mesenteric collateralization. No focal liver lesion. Normal appearance of the gallbladder. No biliary ductal dilatation. Pancreas: Unremarkable. No pancreatic ductal dilatation or surrounding inflammatory changes. Spleen: Normal in size without focal abnormality. Adrenals/Urinary Tract: Adrenal glands are unremarkable. Punctate left kidney interpolar nonobstructing stone. Ureter stone or hydronephrosis. Normal bladder. Stomach/Bowel: Stomach is within normal limits. Appendix appears normal. No evidence of  bowel wall thickening, distention, or inflammatory changes. Vascular/Lymphatic: Aortic atherosclerosis. No enlarged abdominal or pelvic lymph nodes. Reproductive: Prostate is unremarkable. Other: Small volume of  ascites. Musculoskeletal: No fracture is seen. IMPRESSION: 1. Heterogeneity of the liver parenchyma, small volume ascites, recanalization of the umbilical vein, and mesenteric collaterals. 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 2.  Aortic Atherosclerosis (ICD10-I70.0). 3. Punctate left kidney nonobstructing stone. Electronically Signed   By: Mitzi Hansen M.D.   On: 06/17/2018 22:50     Dollene Cleveland, DO 06/21/2018, 8:11 AM PGY-1, [facility] FPTS Intern pager: [phone], text pages welcome

## 2018-06-21 NOTE — Progress Notes (Signed)
Pt continues to awake very confused and restless. Has attempted to climb out of bed multiple times and was observed trying to bite bracelet off. At present, pt is resting but will wake suddenly and impulsively try to get out of bed. Continues to be very disoriented and hard to redirect. Contacted MD, will continue to monitor closely.

## 2018-06-21 NOTE — Progress Notes (Signed)
Physical Therapy Treatment Patient Details Name: Alfred Kelley MRN: [id number] DOB: 1956-02-22 Today's Date: 06/21/2018    History of Present Illness 62 y.o. male with past medical history of alcohol abuse brought into the hospital by brother for evaluation of jaundice and confusion yesterday.  Upon initial evaluation, he was found to have jaundice with total bilirubin of 12.8, AST 154, ALT 85, normal alkaline phosphatase.  INR 1.98.  CBC showed mild leukocytosis with WBC 13.0,  hemoglobin of 11.9 and platelet count of 118.  CT abdomen pelvis with IV contrast showed heterogenicity of the liver parenchyma and small volume ascites with differential of cirrhosis and possible concurrent acute hepatitis.    PT Comments    Pt woke from a long sleep today and became clearer and participative to the joy of his family.  Emphasis on sitting and standing balance ie donning socks and washing hands post toileting, transitions and gait stability.   Follow Up Recommendations  No PT follow up;Supervision - Intermittent     Equipment Recommendations  Other (comment)    Recommendations for Other Services       Precautions / Restrictions Precautions Precautions: Fall Restrictions Weight Bearing Restrictions: No    Mobility  Bed Mobility Overal bed mobility: Needs Assistance Bed Mobility: Supine to Sit;Sit to Supine     Supine to sit: Min guard Sit to supine: Min guard   General bed mobility comments: Pt transferred from stedy to seated edge of bed with two NT presents to assist back to bed.    Transfers Overall transfer level: Needs assistance Equipment used: Rolling walker (2 wheeled) Transfers: Sit to/from Stand Sit to Stand: Min assist         General transfer comment: Pt required assistance to boost into standing.  Presents with excessive hip flexion require cues and facilitation to achieve hip extension for placement of stedy plates.  Pt slow to initiate movement into standing  from low seated commode.    Ambulation/Gait Ambulation/Gait assistance: Min assist;Mod assist Gait Distance (Feet): 75 Feet Assistive device: Rolling walker (2 wheeled) Gait Pattern/deviations: Step-through pattern Gait velocity: decreased Gait velocity interpretation: <1.31 ft/sec, indicative of household ambulator General Gait Details: mildly unsteady and retropulsive with tremulous limbs, legs worsening with fatigue.   Stairs             Wheelchair Mobility    Modified Rankin (Stroke Patients Only)       Balance Overall balance assessment: Needs assistance Sitting-balance support: No upper extremity supported;Feet supported Sitting balance-Leahy Scale: Fair       Standing balance-Leahy Scale: Poor Standing balance comment: standing at sick to wash hands, reaching for soap, towels, min cues and assist                            Cognition Arousal/Alertness: Awake/alert Behavior During Therapy: WFL for tasks assessed/performed Overall Cognitive Status: Impaired/Different from baseline Area of Impairment: Orientation;Attention;Memory;Following commands;Safety/judgement;Awareness;Problem solving                 Orientation Level: Disoriented to;Time;Situation;Place Current Attention Level: Sustained Memory: Decreased short-term memory Following Commands: Follows one step commands with increased time   Awareness: Intellectual Problem Solving: Slow processing General Comments: Family very happy with pt's attension and communication today      Exercises      General Comments        Pertinent Vitals/Pain Pain Assessment: No/denies pain    Home Living  Prior Function            PT Goals (current goals can now be found in the care plan section) Acute Rehab PT Goals Patient Stated Goal: not stated PT Goal Formulation: With patient/family Time For Goal Achievement: 07/02/18 Potential to Achieve Goals:  Good Progress towards PT goals: Progressing toward goals    Frequency    Min 3X/week      PT Plan Current plan remains appropriate    Co-evaluation              AM-PAC PT "6 Clicks" Daily Activity  Outcome Measure  Difficulty turning over in bed (including adjusting bedclothes, sheets and blankets)?: A Little Difficulty moving from lying on back to sitting on the side of the bed? : A Little Difficulty sitting down on and standing up from a chair with arms (e.g., wheelchair, bedside commode, etc,.)?: Unable Help needed moving to and from a bed to chair (including a wheelchair)?: A Little Help needed walking in hospital room?: A Little Help needed climbing 3-5 steps with a railing? : A Lot 6 Click Score: 15    End of Session Equipment Utilized During Treatment: Gait belt Activity Tolerance: Patient tolerated treatment well(degraded, but not limited by fatigue) Patient left: in bed;with call bell/phone within reach;with bed alarm set;with family/visitor present Nurse Communication: Mobility status PT Visit Diagnosis: Unsteadiness on feet (R26.81)     Time: 5784-6962 PT Time Calculation (min) (ACUTE ONLY): 40 min  Charges:  $Gait Training: 8-22 mins $Therapeutic Activity: 8-22 mins $Self Care/Home Management: 8-22                     06/21/2018  [place] Bing, PT Acute Rehabilitation Services [phone]  (pager) [phone]  (office)   Eliseo Gum Azariah Bonura 06/21/2018, 6:09 PM

## 2018-06-21 NOTE — Progress Notes (Signed)
Pt exhibiting increased confusion, attempting to get out of bed and not following commands. Pt shaking and agitated. Contacted MD, will continue to monitor.

## 2018-06-21 NOTE — Plan of Care (Signed)
Behavior is much better with family present but gets very confused and tries to get up out of bed if no one is present. AVA Telemonitor in room but patient is not redirectable. 1:1 safety sitter ordered for tonight while no family is present. Patient is very unstable and requires a 2 person assist with walker to transfer from bed to chair or bed to [facility].

## 2018-06-22 LAB — COMPREHENSIVE METABOLIC PANEL
ALBUMIN: 1.8 g/dL — AB (ref 3.5–5.0)
ALT: 51 U/L — ABNORMAL HIGH (ref 0–44)
AST: 94 U/L — AB (ref 15–41)
Alkaline Phosphatase: 61 U/L (ref 38–126)
Anion gap: 2 — ABNORMAL LOW (ref 5–15)
BUN: 8 mg/dL (ref 6–20)
CHLORIDE: 102 mmol/L (ref 98–111)
CO2: 31 mmol/L (ref 22–32)
Calcium: 8.2 mg/dL — ABNORMAL LOW (ref 8.9–10.3)
Creatinine, Ser: 0.73 mg/dL (ref 0.61–1.24)
GFR calc Af Amer: >60 mL/min (ref >60)
GFR calc non Af Amer: >60 mL/min (ref >60)
GLUCOSE: 107 mg/dL — AB (ref 70–99)
POTASSIUM: 3.6 mmol/L (ref 3.5–5.1)
SODIUM: 135 mmol/L (ref 135–145)
Total Bilirubin: 6.4 mg/dL — ABNORMAL HIGH (ref 0.3–1.2)
Total Protein: 6.8 g/dL (ref 6.5–8.1)

## 2018-06-22 LAB — CBC
HCT: 27.1 % — ABNORMAL LOW (ref 39.0–52.0)
Hemoglobin: 9.5 g/dL — ABNORMAL LOW (ref 13.0–17.0)
MCH: 36.5 pg — ABNORMAL HIGH (ref 26.0–34.0)
MCHC: 35.1 g/dL (ref 30.0–36.0)
MCV: 104.2 fL — ABNORMAL HIGH (ref 80.0–100.0)
NRBC: 0.2 % (ref 0.0–0.2)
PLATELETS: 121 10*3/uL — AB (ref 150–400)
RBC: 2.6 MIL/uL — AB (ref 4.22–5.81)
RDW: 16.6 % — AB (ref 11.5–15.5)
WBC: 9.2 10*3/uL (ref 4.0–10.5)

## 2018-06-22 NOTE — Progress Notes (Signed)
Patient's labs are marginally improved, but his mental status is markedly improved.  He is oriented to name, name of hospital, and month, but thought the year was "1929."  No asterixis on exam today.  Abdomen is somewhat protuberant and moderately tympanitic.  I do not think significant ascites is present; the CT from 5 days ago showed just a small volume of ascites.  Impression gradually but progressively improving alcoholic hepatitis, with uncertain degree of chronic underlying permanent liver damage.  Recommendation: Continue current management.  At this time, his labs will probably change very gradually so daily lab monitoring is not essential.  The patient is not quite coherent enough yet to really engage in discharge planning with respect to making decisions about an alcohol abstinence program.  Multiple questions answered for family today.  Florencia Reasons, M.D. Pager [phone] If no answer or after 5 PM call [phone]

## 2018-06-22 NOTE — Progress Notes (Signed)
Family Medicine Teaching Service Daily Progress Note Intern Pager: [phone]  Patient name: Alfred Kelley Medical record number: [id number] Date of birth: Dec 08, 1955 Age: 62 y.o. Gender: male  Primary Care Provider: Patient, No Pcp Per Consultants:GI, PT Code Status:Full  Pt Overview and Major Events to Date: 11/1 admitted foracute liver failure, alcohol withdrawal  Assessment and Plan: Alfred Kelley is a 62 yo malepresenting with jaundice, abdominal pain/bloating,confusion.PMH is significant foressential hypertension, alcohol use disorder.  Acute Liver Failure:presentedwith confusion, scleral icterus, abdominal bloating. Extensive alcohol use history,bilirubin 12.8, synthetic liver dysfunction with an INR of 1.89 w/oanticoagulation. He does report a history of questionable hepatitis several years ago associated with foodborne mechanism,? Hepatitis A. Acute liver failure defined by platelets <150K, INR >1.5, elevated LFTs, elevated bili.Maddrey Score48.2(11/2) > 38.1 (11/3)> 48.8 (11/4). -Consult GI, appreciate recs -RecheckdailyMaddrey/Discriminant Function Score -Monitor mental status closely -Limit totalTylenoldose to 2g/24 hours -Plan to keep in hospital until symptom free from withdrawal for ~48 hours. -On Rifaximin 550mg  BID in hospital; will add Lactulose outpatient  -Need safety sitter 24-hour surveillance  Alcohol use disorder:history of the same with recent relapse secondary to stress. Potential etiology of his acute liver failure. Will use CIWAjudiciously as benzodiazepines can accumulate in patients with liver dysfunction.Folic acid wnl.CIWA 9>7>12 overnight 11/6. Patient received Lorazepam 2mg  2135, 2mg  0451 (11/6). - CIWA protocol- limiting ativan to 2mg /doseat this time - closely monitor Mental status - vitamin B1  Hypertension, stable:blood pressure 133/95(11/6) - hold home lisinopril-hydrochlorothiazide 10-12.5mg  - vitals per  floor protocol  Hyponatremia, resolved:Na+ 126 on admission >128 >130 > 133 >133> 135 (11/6).Likely 2/2 fluid shifts with moderate ascites and acute liver failure. - CMPdaily - PO fluid restriction - monitor I/O's - daily weights  Hypokalemia, improving, stable:K+ 3.3 >3.0 >3.4> 3.6 > 3.6(11/5)afterrepletion withKdur.  - CMPdaily  Macrocytic Anemia, stable:Hgb 11.9>10.6>9.2>9.6 (11/5),MCV 101.8. Likely multifactorial, will assess with anemia panel.INR 1.89, reticulocyte count 2.3%, folate within normal limits. Vitamin B12 elevated, ferritin elevated, and iron normal. -Follow-up remaininganemia panel - alcohol cessation counseling  Thrombocytopenia:platelets 118>108, likely 2/2 acute liver failure. Consider d/c lovenox if plts continue to decrease to <50K.Platelets stable at 119>109> 120 > 121 (11/6). - continue to monitor for signs of bleeding  Hyperlipidemia:takes simvastatin at home. No lipid panel in chart review. - holding simvastatin 40mg  daily - lipid panelordered  Moderate protein malnutrition:albumin 2.3 > 2.1 > 1.9 > 1.8 (11/5). Patient has abdominal bloating, negative fluid wave, no lower extremity swelling. - continue to monitor  Hematuria, improved, resolved:h/o frequent nephrolithiasis. CT abdomen showed punctate left nonobstructing stone. Urinalysis revealed >50 RBC, >50WBC, rare bacteria, other tests not reported due to red color interference. Patient denies any pain with urination at any time. He states that he has had 4 days of pink urine last week. This week his urine has been cranberry-colored. He denies smoking history. -consult urology, appreciate recs: Discussed with Dr. Mena Goes - microscopic hematuria likely exacerbated by bili from liver failure, recommend outpatient cystoscopy. Reconsult if problems voiding or worsening.  GERD:patient states that he takes tums frequently at home for abdominal pain that burns and is  relieved with tums and vomiting. -TUMS PRN  FEN/GI:heart healthy with fluid restriction JYN:WGNFAOZ  Disposition:can discharge home once he has been symptom free for 48 hours.  Subjective:  Patient is seen sitting upright in his recliner this morning.  Family reports he has been making great strides to get up and move to be out of bed.  He is still tremulous and difficult to  understand.  He continues to have confabulations, but reports he believes they are dreams.  He denies headaches, chest pain, shortness of breath, nausea, vomiting, abdominal pain, and hematuria.  Objective: Temp:  [98.9 F (37.2 C)-99.5 F (37.5 C)] 99.5 F (37.5 C) (11/06 0634) Pulse Rate:  [106-111] 106 (11/06 0634) Resp:  [18-24] 18 (11/06 0634) BP: (120-134)/(76-95) 133/95 (11/06 0634) SpO2:  [97 %-98 %] 98 % (11/06 1610)  Physical Exam  Constitutional: He is oriented to person, place, and time. He appears well-developed and well-nourished. No distress.  HENT:  Head: Normocephalic.  Eyes: EOM are normal. Scleral icterus is present.  Cardiovascular: Regular rhythm.  Tachycardic to 110.  Pulmonary/Chest: Effort normal and breath sounds normal.  Abdominal: He exhibits distension. He exhibits no pulsatile liver. Bowel sounds are increased. There is no tenderness. No hernia.  Neurological: He is alert and oriented to person, place, and time.  Cranial nerves II through XII grossly intact, asterixis; 5/5 strength in bilateral upper and lower extremities with normal coordination; resting tremor.  Skin: Skin is warm and dry.  Psychiatric: He has a normal mood and affect.   Laboratory: Recent Labs  Lab 06/20/18 0341 06/21/18 0245 06/22/18 0329  WBC 10.3 10.4 9.2  HGB 9.2* 9.6* 9.5*  HCT 27.1* 28.8* 27.1*  PLT 109* 120* 121*   Recent Labs  Lab 06/20/18 0341 06/21/18 0245 06/22/18 0329  NA 133* 133* 135  K 3.6 3.6 3.6  CL 96* 98 102  CO2 30 29 31   BUN 6 8 8   CREATININE 0.67 0.77 0.73   CALCIUM 8.4* 8.1* 8.2*  PROT 6.9 7.1 6.8  BILITOT 7.4* 6.7* 6.4*  ALKPHOS 62 63 61  ALT 57* 56* 51*  AST 104* 104* 94*  GLUCOSE 108* 123* 107*   Imaging/Diagnostic Tests: Dg Chest 1 View  Result Date: 06/17/2018 CLINICAL DATA:  Tachycardia.  Possible alcohol withdrawal. EXAM: CHEST  1 VIEW COMPARISON:  None. FINDINGS: The heart size and mediastinal contours are within normal limits. Both lungs are clear. The visualized skeletal structures are unremarkable. IMPRESSION: Clear lungs. Electronically Signed   By: Deatra Robinson M.D.   On: 06/17/2018 22:16   Ct Abdomen Pelvis W Contrast  Result Date: 06/17/2018 CLINICAL DATA:  62 y/o  M; acute generalized abdominal pain. EXAM: CT ABDOMEN AND PELVIS WITH CONTRAST TECHNIQUE: Multidetector CT imaging of the abdomen and pelvis was performed using the standard protocol following bolus administration of intravenous contrast. CONTRAST:  100 cc Omnipaque 300 COMPARISON:  None. FINDINGS: Lower chest: No acute abnormality. Hepatobiliary: Decreased attenuation in diffuse heterogeneity of the liver. Recanalization of umbilical vein and mesenteric collateralization. No focal liver lesion. Normal appearance of the gallbladder. No biliary ductal dilatation. Pancreas: Unremarkable. No pancreatic ductal dilatation or surrounding inflammatory changes. Spleen: Normal in size without focal abnormality. Adrenals/Urinary Tract: Adrenal glands are unremarkable. Punctate left kidney interpolar nonobstructing stone. Ureter stone or hydronephrosis. Normal bladder. Stomach/Bowel: Stomach is within normal limits. Appendix appears normal. No evidence of bowel wall thickening, distention, or inflammatory changes. Vascular/Lymphatic: Aortic atherosclerosis. No enlarged abdominal or pelvic lymph nodes. Reproductive: Prostate is unremarkable. Other: Small volume of ascites. Musculoskeletal: No fracture is seen. IMPRESSION: 1. Heterogeneity of the liver parenchyma, small volume ascites,  recanalization of the umbilical vein, and mesenteric collaterals. 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 2.  Aortic Atherosclerosis (ICD10-I70.0). 3. Punctate left kidney nonobstructing stone. Electronically Signed   By: Mitzi Hansen M.D.   On: 06/17/2018 22:50     Peggyann Shoals  C, DO 06/22/2018, 8:28 AM PGY-1, [facility] FPTS Intern pager: [phone], text pages welcome

## 2018-06-22 NOTE — Care Management Note (Signed)
Case Management Note  Patient Details  Name: Alfred Kelley MRN: [id number] Date of Birth: 01-Jul-1956  Subjective/Objective:    Liver dysfunction. Hx of alcohol abuse. From home alone.  Supportive family. Independent with ADL's , no DME usage prior to admit.    Alfred Kelley (Brother) Alfred Kelley (Daughter)    [phone]      Action/Plan: Transition to home with brother Alfred Kelley @ d/cwhen medically stable. Pt without PCP, hospital f/u scheduled for 07/05/2018 @ 1:00pm, [facility], pt/mom Made aware and noted on AVS by ncm  Expected Discharge Date:                  Expected Discharge Plan:     In-House Referral:  Clinical Social Work  Discharge planning Services  CM Consult  Post Acute Care Choice:    Choice offered to:     DME Arranged:  N/A DME Agency:  NA  HH Arranged:  NA HH Agency:  NA  Status of Service:  In process, will continue to follow  If discussed at Long Length of Stay Meetings, dates discussed:    Additional Comments:  Epifanio Lesches, RN 06/22/2018, 10:58 AM

## 2018-06-22 NOTE — Progress Notes (Signed)
Physical Therapy Treatment Patient Details Name: Alfred Kelley MRN: [id number] DOB: Aug 15, 1956 Today's Date: 06/22/2018    History of Present Illness 62 y.o. male with past medical history of alcohol abuse brought into the hospital by brother for evaluation of jaundice and confusion yesterday.  Upon initial evaluation, he was found to have jaundice with total bilirubin of 12.8, AST 154, ALT 85, normal alkaline phosphatase.  INR 1.98.  CBC showed mild leukocytosis with WBC 13.0,  hemoglobin of 11.9 and platelet count of 118.  CT abdomen pelvis with IV contrast showed heterogenicity of the liver parenchyma and small volume ascites with differential of cirrhosis and possible concurrent acute hepatitis.    PT Comments    Pt sleeping on arrival, used sternal run to rouse patient and he became agitated.  Pt agreeable to ambulate with PTA but not SPTA present.  Performed gait training and transferred patient to chair.  Plan for continued PT next session to address balance and functional deficits.       Follow Up Recommendations  No PT follow up;Supervision - Intermittent     Equipment Recommendations  Other (comment)    Recommendations for Other Services       Precautions / Restrictions Precautions Precautions: Fall Restrictions Weight Bearing Restrictions: No    Mobility  Bed Mobility Overal bed mobility: Needs Assistance Bed Mobility: Supine to Sit     Supine to sit: Min assist     General bed mobility comments: Pt required assistance to elevate trunk into sitting.    Transfers Overall transfer level: Needs assistance Equipment used: Rolling walker (2 wheeled) Transfers: Sit to/from Stand Sit to Stand: Mod assist         General transfer comment: Cues for hand placement but pulled on RW into standing.    Ambulation/Gait Ambulation/Gait assistance: Min assist;Mod assist Gait Distance (Feet): 75 Feet Assistive device: Rolling walker (2 wheeled) Gait  Pattern/deviations: Step-through pattern;Shuffle;Trunk flexed;Drifts right/left Gait velocity: decreased   General Gait Details: Cues for forward gaze and patient drifting throughout.     Stairs             Wheelchair Mobility    Modified Rankin (Stroke Patients Only)       Balance Overall balance assessment: Needs assistance   Sitting balance-Leahy Scale: Fair       Standing balance-Leahy Scale: Poor                              Cognition Arousal/Alertness: Lethargic;Suspect due to medications Behavior During Therapy: Agitated;Impulsive Overall Cognitive Status: Impaired/Different from baseline                                 General Comments: Pt agitated so cognition not formally assessed.  Pt slurring words and difficult to understand today.        Exercises      General Comments        Pertinent Vitals/Pain Pain Assessment: Faces Faces Pain Scale: Hurts little more Pain Location: generalized Pain Descriptors / Indicators: Grimacing Pain Intervention(s): Monitored during session;Repositioned    Home Living                      Prior Function            PT Goals (current goals can now be found in the care plan section) Acute Rehab PT Goals  Patient Stated Goal: not stated Potential to Achieve Goals: Good Progress towards PT goals: Progressing toward goals    Frequency    Min 3X/week      PT Plan Current plan remains appropriate    Co-evaluation              AM-PAC PT "6 Clicks" Daily Activity  Outcome Measure  Difficulty turning over in bed (including adjusting bedclothes, sheets and blankets)?: A Little Difficulty moving from lying on back to sitting on the side of the bed? : A Little Difficulty sitting down on and standing up from a chair with arms (e.g., wheelchair, bedside commode, etc,.)?: Unable Help needed moving to and from a bed to chair (including a wheelchair)?: A Little Help  needed walking in hospital room?: A Little Help needed climbing 3-5 steps with a railing? : A Lot 6 Click Score: 15    End of Session Equipment Utilized During Treatment: Gait belt Activity Tolerance: Treatment limited secondary to agitation Patient left: in bed;with call bell/phone within reach;with bed alarm set;with family/visitor present Nurse Communication: Mobility status PT Visit Diagnosis: Unsteadiness on feet (R26.81)     Time: 1610-9604 PT Time Calculation (min) (ACUTE ONLY): 16 min  Charges:  $Gait Training: 8-22 mins                     Joycelyn Rua, PTA Acute Rehabilitation Services Pager [phone] Office [phone]     Alfred Kelley Artis Delay 06/22/2018, 6:19 PM

## 2018-06-22 NOTE — Progress Notes (Signed)
   06/22/18 1100  Clinical Encounter Type  Visited With Patient and family together  Visit Type Initial  Referral From Nurse  Consult/Referral To Chaplain  Spiritual Encounters  Spiritual Needs Emotional  Stress Factors  Patient Stress Factors Exhausted  Family Stress Factors Exhausted;Loss of control   Requested visit from Nurse on floor. PT was alert and laying in chair. Mother was at bedside. Mother was thankful for the visit, but stated that now was not a good time due to PT's needs. Chaplain available as needed.  Chaplain Orest Dikes [phone]

## 2018-06-22 NOTE — Plan of Care (Signed)
Patient is making progress toward discharge goal.  CIWA is improving, patient is getting more alert and verbal, disoriented x 2  but easily oriented.  Generalized weakness is still present. 1:1 sitter in room with patient to monitor and assist patient. Will continue to monitor.

## 2018-06-23 LAB — CBC
HCT: 27.9 % — ABNORMAL LOW (ref 39.0–52.0)
Hemoglobin: 9.5 g/dL — ABNORMAL LOW (ref 13.0–17.0)
MCH: 35.8 pg — AB (ref 26.0–34.0)
MCHC: 34.1 g/dL (ref 30.0–36.0)
MCV: 105.3 fL — ABNORMAL HIGH (ref 80.0–100.0)
PLATELETS: 118 10*3/uL — AB (ref 150–400)
RBC: 2.65 MIL/uL — ABNORMAL LOW (ref 4.22–5.81)
RDW: 17.1 % — AB (ref 11.5–15.5)
WBC: 8 10*3/uL (ref 4.0–10.5)
nRBC: 0 % (ref 0.0–0.2)

## 2018-06-23 LAB — CULTURE, BLOOD (ROUTINE X 2)
Culture: NO GROWTH
Culture: NO GROWTH
SPECIAL REQUESTS: ADEQUATE
Special Requests: ADEQUATE

## 2018-06-23 LAB — COMPREHENSIVE METABOLIC PANEL
ALT: 48 U/L — ABNORMAL HIGH (ref 0–44)
ANION GAP: 9 (ref 5–15)
AST: 87 U/L — ABNORMAL HIGH (ref 15–41)
Albumin: 1.8 g/dL — ABNORMAL LOW (ref 3.5–5.0)
Alkaline Phosphatase: 57 U/L (ref 38–126)
BUN: 8 mg/dL (ref 6–20)
CO2: 24 mmol/L (ref 22–32)
Calcium: 8.1 mg/dL — ABNORMAL LOW (ref 8.9–10.3)
Chloride: 102 mmol/L (ref 98–111)
Creatinine, Ser: 0.67 mg/dL (ref 0.61–1.24)
GFR calc non Af Amer: >60 mL/min (ref >60)
GLUCOSE: 104 mg/dL — AB (ref 70–99)
POTASSIUM: 3.5 mmol/L (ref 3.5–5.1)
SODIUM: 135 mmol/L (ref 135–145)
Total Bilirubin: 6 mg/dL — ABNORMAL HIGH (ref 0.3–1.2)
Total Protein: 6.7 g/dL (ref 6.5–8.1)

## 2018-06-23 NOTE — Progress Notes (Signed)
Family Medicine Teaching Service Daily Progress Note Intern Pager: [phone]  Patient name: Alfred Kelley Medical record number: [id number] Date of birth: 08-17-1956 Age: 62 y.o. Gender: male  Primary Care Provider: Patient, No Pcp Per Consultants:GI, PT Code Status:Full  Pt Overview and Major Events to Date: 11/1 admitted foracute liver failure, alcohol withdrawal  Assessment and Plan: Alfred Kelley is a 62 yo malepresenting with jaundice, abdominal pain/bloating,confusion.PMH is significant foressential hypertension, alcohol use disorder.  Acute Liver Failure:presentedwith confusion, scleral icterus, abdominal bloating. Extensive alcohol use history,bilirubin 12.8, synthetic liver dysfunction with an INR of 1.89 w/oanticoagulation. He does report a history of questionable hepatitis several years ago associated with foodborne mechanism,? Hepatitis A. Acute liver failure defined by platelets <150K, INR >1.5, elevated LFTs, elevated bili.Maddrey Score48.2(11/2) > 38.1 (11/3)> 48.8 (11/4). - Consult GI, appreciate recs - as labs are stable recommend stopping daily labs, FPTS agrees. - Monitor mental status closely - Limit totalTylenoldose to 2g/24 hours - Plan to keep in hospital until symptom free from withdrawal for ~48 hours. - On Rifaximin 550mg  BID in hospital; will add Lactulose outpatient  - Need safety sitter 24-hour surveillance  Alcohol use disorder:history of the same with recent relapse secondary to stress. Potential etiology of his acute liver failure. Will use CIWAjudiciously as benzodiazepines can accumulate in patients with liver dysfunction.Folic acid wnl.CIWA6>6>13 overnight 11/7. Patient receivedLorazepam 2mg 0052, 2mg  0421, 2mg  [id number] (11/7). - CIWA protocol- limiting ativan to2mg /doseat this time - closely monitor Mental status - vitamin B1  Hypertension, stable:blood pressure 123/75(11/7) - hold home lisinopril-hydrochlorothiazide  10-12.5mg  - vitals per floor protocol  Hyponatremia, resolved:Na+ 126 on admission >128 >130 >133 >133> 135 > 135 (11/7).Likely 2/2 fluid shifts with moderate ascites and acute liver failure. - Stop checking daily BMPs - PO fluid restriction  Hypokalemia, improving, stable:K+ 3.3 >3.0 >3.4>3.6 > 3.6 > 3.6> 3.5 (11/7)afterrepletion withKdur.  - Stop ordering daily labs  Macrocytic Anemia, stable:Hgb 11.9>10.6>9.2>9.6> 9.5 > 9.5 (11/7),MCV 101.8. Likely multifactorial, will assess with anemia panel.INR 1.89, reticulocyte count 2.3%, folate within normal limits. Vitamin B12 elevated, ferritinelevated,andironnormal. -Stop checking daily CBC - alcohol cessation counseling  Thrombocytopenia, stable:platelets 118>108, likely 2/2 acute liver failure. Consider d/c lovenox if plts continue to decrease to <50K.Platelets stable at 119>109> 120 > 121 > 118 (11/7). - continue to monitor for signs of bleeding - Stop checking daily CBC - will recheck before discharge  Hyperlipidemia:takes simvastatin at home. No lipid panel in chart review.HDL low at 20, LDL high at 157. - holding simvastatin 40mg  daily  Moderate protein malnutrition:albumin 2.3> 2.1 > 1.9 > 1.9 > 1.8 > 1.8 (11/7). Patient has abdominal bloating, negative fluid wave, no lower extremity swelling. - continue to monitor  Hematuria, improved, resolved:h/o frequent nephrolithiasis. CT abdomen showed punctate left nonobstructing stone. Urinalysis revealed >50 RBC, >50WBC, rare bacteria, other tests not reported due to red color interference. Patient denies any pain with urination at any time. He states that he has had 4 days of pink urine last week. This week his urine has been cranberry-colored. He denies smoking history. -consult urology, appreciate recs: Discussed with Dr. Mena Goes - microscopic hematuria likely exacerbated by bili from liver failure, recommend outpatient cystoscopy. Reconsult  if problems voiding or worsening.  GERD:patient states that he takes tums frequently at home for abdominal pain that burns and is relieved with tums and vomiting. -TUMS PRN  FEN/GI:heart healthy with fluid restriction JYN:[id number]  Disposition:can discharge home once he has been symptom free for 48 hours.  Subjective:  He  was seen this morning sleeping in bed. He is very confused and difficult to understand. He is hallucinating and difficult to communicate with.   Objective: Temp:  [97.9 F (36.6 C)-98.1 F (36.7 C)] 97.9 F (36.6 C) (11/07 0431) Pulse Rate:  [102-105] 102 (11/07 0431) Resp:  [18-19] 18 (11/07 0431) BP: (123-133)/(75-85) 123/75 (11/07 0431) SpO2:  [94 %-99 %] 95 % (11/07 0431)  Physical Exam  Constitutional: He is oriented to person, place, and time. He appears well-developed and well-nourished. No distress.  HENT:  Head: Normocephalic.  Eyes: EOM are normal. Scleral icterus is present.  Cardiovascular: Regular rhythm.  Tachycardic to 110.  Pulmonary/Chest: Effort normal and breath sounds normal.  Abdominal: He exhibits distension. He exhibits no pulsatile liver. Bowel sounds are increased. There is no tenderness. No hernia.  Neurological: He is alert and oriented to person, place, and time.  Cranial nerves II through XII grossly intact, asterixis; 5/5 strength in bilateral upper and lower extremities with normal coordination; resting tremor.  Skin: Skin is warm and dry.  Psychiatric: He has a normal mood and affect.   Laboratory: Recent Labs  Lab 06/21/18 0245 06/22/18 0329 06/23/18 0435  WBC 10.4 9.2 8.0  HGB 9.6* 9.5* 9.5*  HCT 28.8* 27.1* 27.9*  PLT 120* 121* 118*   Recent Labs  Lab 06/21/18 0245 06/22/18 0329 06/23/18 0435  NA 133* 135 135  K 3.6 3.6 3.5  CL 98 102 102  CO2 29 31 24   BUN 8 8 8   CREATININE 0.77 0.73 0.67  CALCIUM 8.1* 8.2* 8.1*  PROT 7.1 6.8 6.7  BILITOT 6.7* 6.4* 6.0*  ALKPHOS 63 61 57  ALT 56* 51* 48*  AST  104* 94* 87*  GLUCOSE 123* 107* 104*   Imaging/Diagnostic Tests: Dg Chest 1 View  Result Date: 06/17/2018 CLINICAL DATA:  Tachycardia.  Possible alcohol withdrawal. EXAM: CHEST  1 VIEW COMPARISON:  None. FINDINGS: The heart size and mediastinal contours are within normal limits. Both lungs are clear. The visualized skeletal structures are unremarkable. IMPRESSION: Clear lungs. Electronically Signed   By: Deatra Robinson M.D.   On: 06/17/2018 22:16   Ct Abdomen Pelvis W Contrast  Result Date: 06/17/2018 CLINICAL DATA:  62 y/o  M; acute generalized abdominal pain. EXAM: CT ABDOMEN AND PELVIS WITH CONTRAST TECHNIQUE: Multidetector CT imaging of the abdomen and pelvis was performed using the standard protocol following bolus administration of intravenous contrast. CONTRAST:  100 cc Omnipaque 300 COMPARISON:  None. FINDINGS: Lower chest: No acute abnormality. Hepatobiliary: Decreased attenuation in diffuse heterogeneity of the liver. Recanalization of umbilical vein and mesenteric collateralization. No focal liver lesion. Normal appearance of the gallbladder. No biliary ductal dilatation. Pancreas: Unremarkable. No pancreatic ductal dilatation or surrounding inflammatory changes. Spleen: Normal in size without focal abnormality. Adrenals/Urinary Tract: Adrenal glands are unremarkable. Punctate left kidney interpolar nonobstructing stone. Ureter stone or hydronephrosis. Normal bladder. Stomach/Bowel: Stomach is within normal limits. Appendix appears normal. No evidence of bowel wall thickening, distention, or inflammatory changes. Vascular/Lymphatic: Aortic atherosclerosis. No enlarged abdominal or pelvic lymph nodes. Reproductive: Prostate is unremarkable. Other: Small volume of ascites. Musculoskeletal: No fracture is seen. IMPRESSION: 1. Heterogeneity of the liver parenchyma, small volume ascites, recanalization of the umbilical vein, and mesenteric collaterals. Findings probably represent cirrhosis, possibly  concurrent acute hepatitis. 2.  Aortic Atherosclerosis (ICD10-I70.0). 3. Punctate left kidney nonobstructing stone. Electronically Signed   By: Mitzi Hansen M.D.   On: 06/17/2018 22:50    Dollene Cleveland, DO 06/23/2018, 8:13 AM PGY-1,  [facility] Intern pager: [phone], text pages welcome

## 2018-06-23 NOTE — Progress Notes (Signed)
Patient's blood work continues to improve in a gradual, but progression fashion.  Meanwhile, his mental status is substantially improved compared to yesterday.  He is much more alert, sitting up in bed, and eating.  He does choke a little bit when drinking liquids, such as coffee.  His sister, Jan, is at the bedside today and had multiple questions about his prognosis and so forth, which were answered during today's 20-minute visit.  My best assessment is that this patient will recover from this episode of acute alcoholic hepatitis.  He no doubt he has some element of chronic underlying liver disease from many years of excessive ethanol consumption, but my initial impression, based on his high level of functioning prior to admission, is that that degree of chronic or irreversible liver damage is probably not going to be functionally significant, at least in the near term.  Realistically, however, the patient's condition and prognosis will become clear as time goes on.  For what it is worth, however, there is no definite radiographic evidence or historical evidence to suggest that he has pre-existing underlying cirrhosis.  I will request a speech therapy swallowing evaluation in view of the patient's difficulty drinking liquids, which I think is partly due to his still somewhat somnolent mental status, the fact that he is not sitting upright in bed, etc., rather than due to intrinsic swallowing dysfunction.  As the patient's mental status improves over the next several days, it would be appropriate to engage case management to assist with disposition issues, including an alcohol abstinence program whether it be through an inpatient rehab facility or an outpatient program.  In the meantime, the patient will probably need physical therapy assistance to help with strengthening and conditioning.  We will continue to follow with you.  Please call at any time if you have questions.  Florencia Reasons,  M.D. Pager [phone] If no answer or after 5 PM call [phone]

## 2018-06-24 NOTE — Progress Notes (Signed)
Rehab Admissions Coordinator Note:  Patient was screened by Clois Dupes for appropriateness for an Inpatient Acute Rehab Consult per PT recommendation.  At this time, we are recommending Inpatient Rehab consult and OT eval if pt would like to be considered for admit. Please advise.  Ottie Glazier, RN, MSN Rehab Admissions Coordinator [phone] 06/24/2018 3:26 PM

## 2018-06-24 NOTE — Progress Notes (Signed)
Nutrition Follow-up  DOCUMENTATION CODES:   Not applicable  INTERVENTION:   - Continue MVI with minerals, thiamine, and folvite  - Continue Ensure Enlive BID  NUTRITION DIAGNOSIS:   Increased nutrient needs related to chronic illness(cirrhosis, hepatitis) as evidenced by estimated needs.  Ongoing  GOAL:   Patient will meet greater than or equal to 90% of their needs  Not consistently meeting  MONITOR:   PO intake, Supplement acceptance, Labs, Weight trends, I & O's, Skin   PO intake: variable, 50-75% of meals and 20-90% of snacks, per nsg documentation Supplement acceptance: continue as ordered Labs: sodium WNL, glucose 104, AST 87 and trending down, ALT 48 and trending down, tBili 6.0 and trending down, Hgb 9.5 and Hct 27.9, both trending down Wt trends: ordered new wt Skin: jaundice is not prominent, ecchymosis still present  ASSESSMENT:   62 y.o. male with past medical history of alcohol abuse presents for evaluation of jaundice and confusion. Pt found to have jaundice with total bilirubin of 12.8, AST 154, ALT 85, normal alkaline phosphatase.  INR 1.98.  CBC showed mild leukocytosis with WBC 13.0,  hemoglobin of 11.9 and platelet count of 118.  CT abdomen pelvis with IV contrast showed heterogenicity of the liver parenchyma and small volume ascites with differential of cirrhosis and possible concurrent acute hepatitis.  11/8 follow up  Meds: Ensure Enlive BID, folvite 1 mg tablet daily, MVI with minerals 1 tablet daily, senokot 8.6 mg tablet BID, NaCl 3 mL q 12 hrs, thiamine 100 mg daily  Pt sitting in chair. Brother and sister present at time of visit.  Pt in good spirits. Denies nausea, vomiting, diarrhea or constipation. Brother reports last BM 11/7 x3. Sister expressed concern over swallowing, states pt has choked on liquids multiple times. Per nsg, MD thought it may have been related to AMS/was a one time incident. During nutrition follow up, pt swallowed pills  with Ensure and had no trouble.   Pt and family concerned about timing of meals - varied intake may be d/t trays arriving late yesterday. Family actively encouraging pt to eat and drink nutritional supplements. Continue Ensure Enlive BID.  Diet Order:   Diet Order            Diet Heart Room service appropriate? Yes; Fluid consistency: Thin  Diet effective now             EDUCATION NEEDS:  No education needs have been identified at this time  Skin:  Skin Assessment: Reviewed RN Assessment  Last BM:  11/6, type 4  Height:  Ht Readings from Last 1 Encounters:  06/19/18 5\' 9"  (1.753 m)    Weight:  Wt Readings from Last 1 Encounters:  06/19/18 78.6 kg    Ideal Body Weight:  72.7 kg  BMI:  Body mass index is 25.59 kg/m.  Estimated Nutritional Needs:   Kcal:  2100-2300  Protein:  110-130 grams  Fluid:  2.1 - 2.3 L/day  Jolaine Artist, MS, RDN, LDN On-call pager: [phone]

## 2018-06-24 NOTE — Progress Notes (Signed)
Family Medicine Teaching Service Daily Progress Note Intern Pager: [phone]  Patient name: Alfred Kelley Medical record number: [id number] Date of birth: 09-26-1955 Age: 62 y.o. Gender: male  Primary Care Provider: Patient, No Pcp Per Consultants: GI, PT Code Status: Full code  Pt Overview and Major Events to Date:  Admitted: 06/17/2018 Hospital Day: 8   Assessment and Plan: Alfred Kelley is a 63 y.o. male who presented with jaundice, abdominal pain, confusion concerning for acute liver failure. PMHx is significant for essential hypertension, hyperlipidemia and alcohol use disorder.  #Delirium tremens, stable, improving  Brother reports that he appears to be improving but definitely continues to be confused and not at baseline.  Patient CIWA scores are progressively improving. Spoke to overnight nurse about CIWA scoring.  He reports that his family was with him for most of the day and other times he was sleeping.  Asked nurse to place sleeping in comment box so that objective data can be trended appropriately.  Patient CIWA scores charted over the last 24 hours are 6 and 4.  Patient received 2 mg of Ativan x 3 in the last 24 hours.  Called to floor this morning and spoke to day nurse about the charting inconsistencies.  Who has been charting appropriately and asked her to communicate this with oncoming staff, as patient is being examined by same team but not same provider every day.    Continue CIWA protocol, checking every 4 hours  Changing Ativan 2 grams to every 4 hours as needed if CIWA > 8  Continue thiamine, multivitamins, folate  Appreciate case management assistance and recommendations for disposition.  Patient can be discharged when symptom-free for 48 hours  Continue rifaximin 550 mg twice daily, add lactulose outpatient  #Abdominal distention  Patient complains of abdominal fullness.  He has been trying to make himself vomit at night to help relieve this fullness.  On  physical exam his abdomen is tympanic.  Possibly ascites likely secondary to acute liver failure.  Abdominal ultrasound for ascites  Acute liver failure, stable, improving Labs remained stable and per GI, stopped daily labs for this reason.  Daily physical exam, asterixis, mental status  Limit acetaminophen, holding atorvastatin  Appreciate GI recommendations as they continue to follow  Hypertension, chronic, stable Blood pressures have been stable without medication  Holding home lisinopril hydrochlorothiazide  Hyperlipidemia  Holding home atorvastatin 40 mg in setting of liver injury  Most recent lipid panel within normal limits  We will continue to follow outpatient  Macrocytic anemia with thrombocytopenia likely secondary to alcohol related acute liver failure Have not continued to check CBCs as they were stable.  Check CBC with differential and INR prior to expected discharge  Patient will need follow-up outpatient  Continue multivitamins, folate, thiamine  GERD  Tums as needed  Protein Calorie Malnutrition  Continue Ensure live twice daily between meals  Hospital problems resolved Hyponatremia: BMP prior to discharge with outpatient follow-up.  1500 mL fluid restriction daily. Hypokalemia: BMP prior to discharge with outpatient follow-up Hematuria: Will need outpatient follow-up for recommended cystoscopy in setting of history of frequent nephrolithiasis.  Microscopic hematuria likely exacerbated by bili from liver failure.  Electrolytes: Replete PRN Nutrition: Heart healthy with fluid restriction 1500 mL GI ppx: Tums, senna 1 tablet twice daily DVT ppx: Lovenox 40 mg every 24 hours  Future labs: No labs for tomorrow morning Disposition: location to be decided.  Patient will have to be asymptomatic for 48 hours.  PRN Meds: sodium  chloride, acetaminophen **OR** acetaminophen, calcium carbonate, LORazepam **OR** LORazepam, ondansetron **OR** ondansetron  (ZOFRAN) IV  ================================================= ================================================= Subjective:  Patient reports doing well overnight.  Patient's brother is at bedside this morning.  Patient reports that he has been attempting to make himself vomit as he feels like he has full belly.  He is otherwise voiding well with appropriate bowel movements.  Patient has no complaints this morning.  Patient is at times tearful this morning and is also repeating that he will do anything the doctors asked him to do to get better.   Interval PRN medications 3 doses of 2 mg Ativan in last 24 hours.  Objective: Vital Signs Temp:  [98.1 F (36.7 C)-99 F (37.2 C)] 98.1 F (36.7 C) (11/08 2134) Pulse Rate:  [107-109] 109 (11/08 2134) Resp:  [19] 19 (11/08 2134) BP: (109-120)/(57-72) 120/72 (11/08 2134) SpO2:  [90 %-94 %] 94 % (11/08 2134)  Intake/Output 11/07 0701 - 11/08 0700 In: 523 [P.O.:520; I.V.:3] Out: -   Physical Exam:  Gen: NAD, alert, non-toxic, well-appearing, sitting comfortably in chair.  Skin: Warm and dry. No obvious rashes, lesions, or trauma. HEENT: NCAT No conjunctival pallor or injection.  Mild scleral scleral icterus.  MMM.  CV: Regular and tachycardic.  Normal S1-S2. RP 2+ bilaterally. No BLEE. Resp: CTAB.  No wheezing, rales, abnormal lung sounds.  No increased WOB Abd: Positive bowel sounds.  Abdomen appears distended on initial observation.  It is firm on palpation and tympanitic to percussion.  Psych: Cooperative with exam. Pleasant. Makes eye contact. Speech remains slurred. Psychomotor slowing and cognition deficits.  Patient is tearful at times, mild emotional lability. Extremities: Moves all extremities spontaneously. Upper extremities tremulous, with the EPS-like jerks in hands.  No asterixis appreciated. Neuro: CN II-XII grossly intact. No FNDs.   Laboratory: Recent Labs  Lab 06/21/18 0245 06/22/18 0329 06/23/18 0435  WBC 10.4 9.2 8.0   HGB 9.6* 9.5* 9.5*  HCT 28.8* 27.1* 27.9*  PLT 120* 121* 118*   Recent Labs  Lab 06/21/18 0245 06/22/18 0329 06/23/18 0435 06/25/18 0503  NA 133* 135 135 138  K 3.6 3.6 3.5 3.5  CL 98 102 102 105  CO2 29 31 24 29   BUN 8 8 8 6   CREATININE 0.77 0.73 0.67 0.83  CALCIUM 8.1* 8.2* 8.1* 8.1*  PROT 7.1 6.8 6.7  --   BILITOT 6.7* 6.4* 6.0*  --   ALKPHOS 63 61 57  --   ALT 56* 51* 48*  --   AST 104* 94* 87*  --   GLUCOSE 123* 107* 104* 119*    Imaging/Diagnostic Tests: No results found.  Melene Plan, MD 06/24/2018, 11:50 PM PGY-1, [facility] Family Medicine FPTS Intern pager: [phone], text pages welcome

## 2018-06-24 NOTE — Progress Notes (Signed)
Family Medicine Teaching Service Daily Progress Note Intern Pager: [phone]  Patient name: Alfred Kelley Medical record number: [id number] Date of birth: [birth year] Age: 62 y.o. Gender: male  Primary Care Provider: Patient, No Pcp Per Consultants:GI, PT Code Status:Full  Pt Overview and Major Events to Date: 11/1 admitted foracute liver failure, alcohol withdrawal  Assessment and Plan: Alfred Kelley is a 62 yo malepresenting with jaundice, abdominal pain/bloating,confusion.PMH is significant foressential hypertension, alcohol use disorder.  Acute Liver Failure, stable:Continuing to monitor as patient continues to withdrawal. - Consult GI, appreciate recs - as labs are stable recommend stopping daily labs, FPTS agrees. - Monitor mental status closely - Limit totalTylenoldose to 2g/24 hours - Plan to keep in hospital until symptom free from withdrawal for ~48 hours. - OnRifaximin550mg  BID in hospital; will addLactulose outpatient - Need safety sitter 24-hour surveillance -PT/OT/SLP with swallow evaluation  Alcohol use disorder:history of the same with recent relapse secondary to stress. Potential etiology of his acute liver failure. Will use CIWAjudiciously as benzodiazepines can accumulate in patients with liver dysfunction.Folic acid wnl.CIWA7 overnight 11/8. Patient receivedLorazepam2mg 0002, 2mg  0254, (11/8). - CIWA protocol- limiting ativan to2mg /doseat this time - closely monitor Mental status - vitamin B1 - Plan to engage case management to assist with disposition issues, including alcohol abstinence program inpatient/outpatient as patient mentation improves.  Hypertension, stable:blood pressure109/57(11/8)  - hold home lisinopril-hydrochlorothiazide 10-12.5mg  - vitals per floor protocol  Hyponatremia,resolved:Na+ 126 on admission >128 >130 >133 >133>135> 135 (11/7).Likely 2/2 fluid shifts with moderate ascites and acute liver  failure. - Stop checking daily BMPs - PO fluid restriction  Hypokalemia, improving, stable:K+ 3.3 >3.0 >3.4>3.6 >3.6 > 3.6> 3.5 (11/7)afterrepletion withKdur.  - Stop ordering daily labs  Macrocytic Anemia, stable:Hgb 11.9>10.6>9.2>9.6> 9.5 > 9.5 (11/7),MCV 101.8. Likely multifactorial, will assess with anemia panel.INR 1.89, reticulocyte count 2.3%, folate within normal limits. Vitamin B12 elevated, ferritinelevated,andironnormal. -Stop checking daily CBC - alcohol cessation counseling  Thrombocytopenia, stable:platelets 118>108, likely 2/2 acute liver failure. Consider d/c lovenox if plts continue to decrease to <50K.Platelets stable at 119>109>120 >121 > 118 (11/7). - continue to monitor for signs of bleeding - Stop checking daily CBC - will recheck before discharge  Hyperlipidemia:takes simvastatin at home. No lipid panel in chart review.HDL low at 20, LDL high at 157. - holding simvastatin 40mg  daily  Moderate protein malnutrition:albumin 2.3> 2.1 > 1.9>1.9 > 1.8 > 1.8 (11/7). Patient has abdominal bloating, negative fluid wave, no lower extremity swelling. - continue to monitor  Hematuria, improved, resolved:h/o frequent nephrolithiasis. CT abdomen showed punctate left nonobstructing stone. Urinalysis revealed >50 RBC, >50WBC, rare bacteria, other tests not reported due to red color interference. Patient denies any pain with urination at any time. He states that he has had 4 days of pink urine last week. This week his urine has been cranberry-colored. He denies smoking history. -consult urology, appreciate recs: Discussed with Dr. Mena Goes - microscopic hematuria likely exacerbated by bili from liver failure, recommend outpatient cystoscopy. Reconsult if problems voiding or worsening.  GERD:patient states that he takes tums frequently at home for abdominal pain that burns and is relieved with tums and vomiting. -TUMS  PRN  FEN/GI:heart healthy with fluid restriction JYN:WGNFAOZ  Disposition:can discharge home once he has been symptom free for 48 hours.  Subjective:  Patient is seen this morning resting in bed.  He slept well last night and only required 4 mg total of lorazepam was CIWA scores of 7.  He reports he had to visit a jail last night and  was very busy.  He is pleasant, talkative, and interactive, but continues to confabulate.  Objective: Temp:  [98 F (36.7 C)-99 F (37.2 C)] 99 F (37.2 C) (11/08 0555) Pulse Rate:  [105-115] 107 (11/08 0555) Resp:  [15-19] 19 (11/08 0555) BP: (100-128)/(57-82) 109/57 (11/08 0555) SpO2:  [90 %-95 %] 90 % (11/08 0555)  Physical Exam  Constitutional: He appears well-developed and well-nourished. No distress.  HENT:  Head: Normocephalic.  Eyes: EOM are normal. Scleral icterus (Improving) is present.  Cardiovascular: Regular rhythm.  Tachycardic to 110.  Pulmonary/Chest: Effort normal and breath sounds normal.  Abdominal: He exhibits distension. He exhibits no pulsatile liver. Bowel sounds are increased. There is no tenderness. No hernia.  Neurological: He is alert.  Cranial nerves II through XII grossly intact, resting tremor continues, he states he is in [place] and believes it is 2009.  He is aware that he is confused.  Skin: Skin is warm and dry.  Psychiatric: He has a normal mood and affect.   Laboratory: Recent Labs  Lab 06/21/18 0245 06/22/18 0329 06/23/18 0435  WBC 10.4 9.2 8.0  HGB 9.6* 9.5* 9.5*  HCT 28.8* 27.1* 27.9*  PLT 120* 121* 118*   Recent Labs  Lab 06/21/18 0245 06/22/18 0329 06/23/18 0435  NA 133* 135 135  K 3.6 3.6 3.5  CL 98 102 102  CO2 29 31 24   BUN 8 8 8   CREATININE 0.77 0.73 0.45  CALCIUM 8.1* 8.2* 8.1*  PROT 7.1 6.8 6.7  BILITOT 6.7* 6.4* 6.0*  ALKPHOS 63 61 57  ALT 56* 51* 48*  AST 104* 94* 87*  GLUCOSE 123* 107* 104*   Imaging/Diagnostic Tests: Dg Chest 1 View  Result Date:  06/17/2018 CLINICAL DATA:  Tachycardia.  Possible alcohol withdrawal. EXAM: CHEST  1 VIEW COMPARISON:  None. FINDINGS: The heart size and mediastinal contours are within normal limits. Both lungs are clear. The visualized skeletal structures are unremarkable. IMPRESSION: Clear lungs. Electronically Signed   By: Deatra Robinson M.D.   On: 06/17/2018 22:16   Ct Abdomen Pelvis W Contrast  Result Date: 06/17/2018 CLINICAL DATA:  62 y/o  M; acute generalized abdominal pain. EXAM: CT ABDOMEN AND PELVIS WITH CONTRAST TECHNIQUE: Multidetector CT imaging of the abdomen and pelvis was performed using the standard protocol following bolus administration of intravenous contrast. CONTRAST:  100 cc Omnipaque 300 COMPARISON:  None. FINDINGS: Lower chest: No acute abnormality. Hepatobiliary: Decreased attenuation in diffuse heterogeneity of the liver. Recanalization of umbilical vein and mesenteric collateralization. No focal liver lesion. Normal appearance of the gallbladder. No biliary ductal dilatation. Pancreas: Unremarkable. No pancreatic ductal dilatation or surrounding inflammatory changes. Spleen: Normal in size without focal abnormality. Adrenals/Urinary Tract: Adrenal glands are unremarkable. Punctate left kidney interpolar nonobstructing stone. Ureter stone or hydronephrosis. Normal bladder. Stomach/Bowel: Stomach is within normal limits. Appendix appears normal. No evidence of bowel wall thickening, distention, or inflammatory changes. Vascular/Lymphatic: Aortic atherosclerosis. No enlarged abdominal or pelvic lymph nodes. Reproductive: Prostate is unremarkable. Other: Small volume of ascites. Musculoskeletal: No fracture is seen. IMPRESSION: 1. Heterogeneity of the liver parenchyma, small volume ascites, recanalization of the umbilical vein, and mesenteric collaterals. 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 2.  Aortic Atherosclerosis (ICD10-I70.0). 3. Punctate left kidney  nonobstructing stone. Electronically Signed   By: Mitzi Hansen M.D.   On: 06/17/2018 22:50     Dollene Cleveland, DO 06/24/2018, 8:17 AM PGY-1, [facility] FPTS Intern pager: [phone], text pages welcome

## 2018-06-24 NOTE — Progress Notes (Signed)
Physical Therapy Treatment Patient Details Name: ALEJANDRO GAMEL MRN: [id number] DOB: 11/12/1955 Today's Date: 06/24/2018    History of Present Illness 62 y.o. male with past medical history of alcohol abuse brought into the hospital by brother for evaluation of jaundice and confusion yesterday.  Upon initial evaluation, he was found to have jaundice with total bilirubin of 12.8, AST 154, ALT 85, normal alkaline phosphatase.  INR 1.98.  CBC showed mild leukocytosis with WBC 13.0,  hemoglobin of 11.9 and platelet count of 118.  CT abdomen pelvis with IV contrast showed heterogenicity of the liver parenchyma and small volume ascites with differential of cirrhosis and possible concurrent acute hepatitis.    PT Comments    Pt improving slowly, but remains confused.  Covers his confusion/disorientation with humor.  Gait stability remains poor, but improved.  Pt needing min overall with mod assist on occasion due to environmental barriers/challenges and balance challenges including speed and directional changes as well as scanning.    Follow Up Recommendations  CIR;Supervision/Assistance - 24 hour     Equipment Recommendations  Other (comment)    Recommendations for Other Services Rehab consult;OT consult     Precautions / Restrictions Precautions Precautions: Fall    Mobility  Bed Mobility Overal bed mobility: Needs Assistance Bed Mobility: Supine to Sit     Supine to sit: Min assist     General bed mobility comments: slow and mildly effortful, but stability support only  Transfers Overall transfer level: Needs assistance Equipment used: Rolling walker (2 wheeled) Transfers: Sit to/from Stand Sit to Stand: Min assist         General transfer comment: stability assist, especially standign up on the safety matting.  cues for hand placement  Ambulation/Gait Ambulation/Gait assistance: Supervision;Min assist;Mod assist(mod on 2 occasions where pt turned to look at family and  los) Educational psychologist (Feet): 120 Feet Assistive device: Rolling walker (2 wheeled) Gait Pattern/deviations: Step-through pattern Gait velocity: pt able to speed up significantly, but unable to control more than a steady moderate speed.   General Gait Details: mildly unsteady gait overall, but more unsteady with loss of focus, scanning and when runs into stationary objects on the right.  He is frequently unfocused and ran into obstacles x6 on the right.   Stairs             Wheelchair Mobility    Modified Rankin (Stroke Patients Only)       Balance Overall balance assessment: Needs assistance   Sitting balance-Leahy Scale: Fair     Standing balance support: Single extremity supported;No upper extremity supported;During functional activity Standing balance-Leahy Scale: Poor Standing balance comment: still needing external support                            Cognition Arousal/Alertness: Awake/alert Behavior During Therapy: Impulsive;Restless Overall Cognitive Status: Impaired/Different from baseline Area of Impairment: Orientation;Attention;Memory;Following commands;Safety/judgement;Awareness;Problem solving                 Orientation Level: Situation;Time Current Attention Level: Sustained Memory: Decreased short-term memory Following Commands: Follows one step commands with increased time Safety/Judgement: Decreased awareness of safety Awareness: Intellectual Problem Solving: Slow processing;Difficulty sequencing;Requires verbal cues;Requires tactile cues General Comments: pt's behaviors are a little labile, but generally joking and (life of the party like), in attempt to hide some of his confusion.      Exercises      General Comments  Pertinent Vitals/Pain Pain Assessment: No/denies pain    Home Living                      Prior Function            PT Goals (current goals can now be found in the care plan section)  Acute Rehab PT Goals Patient Stated Goal: not stated PT Goal Formulation: With patient/family Time For Goal Achievement: 07/02/18 Potential to Achieve Goals: Good Progress towards PT goals: Progressing toward goals    Frequency    Min 3X/week      PT Plan Current plan remains appropriate    Co-evaluation              AM-PAC PT "6 Clicks" Daily Activity  Outcome Measure  Difficulty turning over in bed (including adjusting bedclothes, sheets and blankets)?: A Little Difficulty moving from lying on back to sitting on the side of the bed? : A Little Difficulty sitting down on and standing up from a chair with arms (e.g., wheelchair, bedside commode, etc,.)?: Unable Help needed moving to and from a bed to chair (including a wheelchair)?: A Little Help needed walking in hospital room?: A Little Help needed climbing 3-5 steps with a railing? : A Little 6 Click Score: 16    End of Session   Activity Tolerance: Patient tolerated treatment well(decreased stamina) Patient left: in chair;with call bell/phone within reach;with family/visitor present;with chair alarm set Nurse Communication: Mobility status PT Visit Diagnosis: Unsteadiness on feet (R26.81)     Time: 1018-1050 PT Time Calculation (min) (ACUTE ONLY): 32 min  Charges:  $Gait Training: 8-22 mins $Therapeutic Activity: 8-22 mins                     06/24/2018  Blackey Bing, PT Acute Rehabilitation Services [phone]  (pager) [phone]  (office)   Eliseo Gum Mersades Barbaro 06/24/2018, 3:19 PM

## 2018-06-24 NOTE — Progress Notes (Signed)
Patient alert & oriented throughout this shift. Did become combative and increasingly disoriented. Ativan as ordered given but continues confusion. Charge nurse notified and in room to assist. Ophelia Charter was dc'd and family was at bedside all shift prior to this assessment to assist patient. They are concerned for his safety, ordered sitter to return for night shift.Daughter and mother became tearful during episode and chaplin service was offered but refused. Family states father and brother are chaplins.Aroma therapy was placed in room per charge nurse.

## 2018-06-24 NOTE — Plan of Care (Signed)
  Problem: Health Behavior/Discharge Planning: Goal: Ability to manage health-related needs will improve Outcome: Progressing   Problem: Clinical Measurements: Goal: Ability to maintain clinical measurements within normal limits will improve Outcome: Progressing Goal: Will remain free from infection Outcome: Progressing Goal: Diagnostic test results will improve Outcome: Progressing   

## 2018-06-24 NOTE — Progress Notes (Signed)
Patient is basically unchanged from yesterday with respect to alcohol withdrawal (still requiring low-dose lorazepam), delirium/encephalopathy.  No new labs but recent LFTs had shown a definite, progressive trend of improvement.  At present, the patient is eating breakfast.  The oropharyngeal dysphagia symptoms he was experiencing yesterday with clear liquids are no longer present; the plan had been for a bedside speech therapy evaluation but from talking with the patient's brother at the bedside, he and I both feel that we can hold off on that at the present time in view of the patient's improvement.  The patient's brother indicated that he walked all the way down the hall and back yesterday evening.  Mental status is about the same as yesterday.  "3+2 = 5" but he cannot tell me the month and he thinks the year is 2020; he is completely incapable of following instructions to do serial twos.  There is no frank jaundice.  The abdomen is distended but quite tympanitic, and there is flank tympany bilaterally suggesting the absence of significant ascites (CT on admission had shown small ascites).  Impression:  1.  Alcoholic hepatitis, improving biochemically 2.  Alcohol use disorder with unknown degree of chronic/underlying hepatic fibrosis and chronic hepatocellular dysfunction. 3.  Altered mental status, probably multifactorial from alcohol withdrawal, medications to control alcohol withdrawal, and perhaps a mild component of hepatic encephalopathy (he did have a few beats of asterixis earlier in his hospitalization; currently is on rifaximin).  Recommendations:  No new recommendations from our standpoint.  We will continue to monitor him with you.  Florencia Reasons, M.D. Pager [phone] If no answer or after 5 PM call [phone]

## 2018-06-25 ENCOUNTER — Inpatient Hospital Stay (HOSPITAL_COMMUNITY): Payer: 59

## 2018-06-25 DIAGNOSIS — R198 Other specified symptoms and signs involving the digestive system and abdomen: Secondary | ICD-10-CM

## 2018-06-25 LAB — BASIC METABOLIC PANEL
ANION GAP: 4 — AB (ref 5–15)
BUN: 6 mg/dL (ref 6–20)
CO2: 29 mmol/L (ref 22–32)
Calcium: 8.1 mg/dL — ABNORMAL LOW (ref 8.9–10.3)
Chloride: 105 mmol/L (ref 98–111)
Creatinine, Ser: 0.83 mg/dL (ref 0.61–1.24)
GFR calc Af Amer: >60 mL/min (ref >60)
GFR calc non Af Amer: >60 mL/min (ref >60)
GLUCOSE: 119 mg/dL — AB (ref 70–99)
POTASSIUM: 3.5 mmol/L (ref 3.5–5.1)
Sodium: 138 mmol/L (ref 135–145)

## 2018-06-25 LAB — GLUCOSE, CAPILLARY: Glucose-Capillary: 109 mg/dL — ABNORMAL HIGH (ref 70–99)

## 2018-06-25 MED ORDER — LACTULOSE 10 GM/15ML PO SOLN
10.0000 g | Freq: Every day | ORAL | Status: DC
  Administered 2018-06-25 – 2018-06-26 (×2): 10 g via ORAL
  Filled 2018-06-25 (×3): qty 15

## 2018-06-25 MED ORDER — LORAZEPAM 1 MG PO TABS: 2.0000 mg | ORAL_TABLET | ORAL | Status: DC | PRN

## 2018-06-25 MED ORDER — LORAZEPAM 2 MG/ML IJ SOLN: 2.0000 mg | INTRAMUSCULAR | Status: DC | PRN

## 2018-06-25 NOTE — Progress Notes (Signed)
Family Medicine Teaching Service Daily Progress Note Intern Pager: [phone]  Patient name: Alfred Kelley Medical record number: [id number] Date of birth: Apr 22, 1956 Age: 62 y.o. Gender: male  Primary Care Provider: Patient, No Pcp Per Consultants: GI Code Status: Full  Pt Overview and Major Events to Date:  06/17/18 admitted for acute liver failure  Assessment and Plan: Alfred Kelley is a 62 y.o. male who presented with jaundice, abdominal pain, confusion concerning for acute liver failure. PMHx is significant for essential hypertension, hyperlipidemia and alcohol use disorder.  Delirium tremens, improving. CIWA scores 4, 4, 0, 0 ,0 overnight. Did not require any ativan in the last 24h  - Monitor on CIWA with prn ativan - Continue thiamine, multivitamins, folate - DC sitter given symptomatic improvement  Acute alcoholic hepatitis with encephalopathy. Found to have moderate ascites on [place].  - GI recs: recommending paracentesis and added lactulose - continue lactulose and rifaximin - diagnostic paracentesis ordered, will follow up on ascites labs  Hypertension,  stable.  - Holding home lisinopril and hydrochlorothiazide - Monitor BP  Hyperlipidemia, stable. Most recent lipid panel within normal limits - Holding home atorvastatin 40 mg in setting of liver injury  Macrocytic anemia with thrombocytopenia 2/2 liver failure, stable.  - Will recheck CBC with differential and INR prior to expected discharge  GERD - Tums as needed  Protein Calorie Malnutrition - Continue Ensure live twice daily between meals  FEN/GI: heart healthy diet PPx: lovenox  Disposition: CIR pending medical improvement. Patient can be discharged when symptom-free for 48 hours  Subjective:  Patient states that he feels very hungry this morning with some abdominal discomfort from distention but no pain. He states that he is no longer having hallucinations. Sitter at bedside states that he has had  multiple bowel movements since start lactulose  Objective: Temp:  [98.2 F (36.8 C)-98.4 F (36.9 C)] 98.4 F (36.9 C) (11/10 0419) Pulse Rate:  [101-107] 101 (11/10 0419) Resp:  [16-20] 18 (11/10 0419) BP: (128-136)/(79-81) 128/79 (11/10 0419) SpO2:  [94 %-97 %] 94 % (11/10 0419) Weight:  [79 kg] 79 kg (11/10 0500) Physical Exam: General: laying in bed comfortably, in NAD Cardiovascular: RRR, no murmurs Respiratory: CTAB, NWOB Abdomen: distended, nontender, positive bowel sounds. No appreciable fluid wave Extremities: warm and well perfused Neuro: alert and oriented, some mild confusion. No asterixis   Laboratory: Recent Labs  Lab 06/21/18 0245 06/22/18 0329 06/23/18 0435  WBC 10.4 9.2 8.0  HGB 9.6* 9.5* 9.5*  HCT 28.8* 27.1* 27.9*  PLT 120* 121* 118*   Recent Labs  Lab 06/21/18 0245 06/22/18 0329 06/23/18 0435 06/25/18 0503  NA 133* 135 135 138  K 3.6 3.6 3.5 3.5  CL 98 102 102 105  CO2 29 31 24 29   BUN 8 8 8 6   CREATININE 0.77 0.73 0.67 0.83  CALCIUM 8.1* 8.2* 8.1* 8.1*  PROT 7.1 6.8 6.7  --   BILITOT 6.7* 6.4* 6.0*  --   ALKPHOS 63 61 57  --   ALT 56* 51* 48*  --   AST 104* 94* 87*  --   GLUCOSE 123* 107* 104* 119*      Imaging/Diagnostic Tests: [place] Ascites (abdomen Limited)  Result Date: 06/25/2018 CLINICAL DATA:  Enlarged abdomen.  History of alcoholism. EXAM: LIMITED ABDOMEN ULTRASOUND FOR ASCITES TECHNIQUE: Limited ultrasound survey for ascites was performed in all four abdominal quadrants. COMPARISON:  CT abdomen and pelvis 06/17/2018. FINDINGS: Moderate ascites is observed, bilaterally. Fluid accumulation is greatest in the  RIGHT lower quadrant and LEFT lower quadrant. IMPRESSION: Moderate ascites is demonstrated sonographically, and confirms the CT finding of ascites of 06/17/2018. Electronically Signed   By: Elsie Stain M.D.   On: 06/25/2018 15:09    Leland Her, DO 06/26/2018, 8:41 AM PGY-3, [facility] FPTS Intern pager:  [phone], text pages welcome

## 2018-06-25 NOTE — Progress Notes (Signed)
Subjective: Patient and examined at bedside and presence of family members.  Objective: Vital signs in last 24 hours: Temp:  [98.1 F (36.7 C)-99 F (37.2 C)] 99 F (37.2 C) (11/09 0404) Pulse Rate:  [109] 109 (11/09 0404) Resp:  [18-19] 18 (11/09 0404) BP: (107-120)/(72-94) 107/94 (11/09 0404) SpO2:  [94 %-97 %] 97 % (11/09 0404) Weight:  [79.1 kg] 79.1 kg (11/09 0404) Weight change:  Last BM Date: 06/24/18  TC:YELYHTM icterus,mild pallor GENERAL: confused, not oriented to place or time ABDOMEN:soft but mildly distended, presence of shifting dullness EXTREMITIES:no edema  Lab Results: Results for orders placed or performed during the hospital encounter of 06/17/18 (from the past 48 hour(s))  Basic metabolic panel     Status: Abnormal   Collection Time: 06/25/18  5:03 AM  Result Value Ref Range   Sodium 138 135 - 145 mmol/L   Potassium 3.5 3.5 - 5.1 mmol/L   Chloride 105 98 - 111 mmol/L   CO2 29 22 - 32 mmol/L   Glucose, Bld 119 (H) 70 - 99 mg/dL   BUN 6 6 - 20 mg/dL   Creatinine, Ser 0.83 0.61 - 1.24 mg/dL   Calcium 8.1 (L) 8.9 - 10.3 mg/dL   GFR calc non Af Amer >60 >60 mL/min   GFR calc Af Amer >60 >60 mL/min    Comment: (NOTE) The eGFR has been calculated using the CKD EPI equation. This calculation has not been validated in all clinical situations. eGFR's persistently <60 mL/min signify possible Chronic Kidney Disease.    Anion gap 4 (L) 5 - 15    Comment: Performed at [facility] [address], [place], [place] [address]  Glucose, capillary     Status: Abnormal   Collection Time: 06/25/18  8:18 AM  Result Value Ref Range   Glucose-Capillary 109 (H) 70 - 99 mg/dL    Studies/Results: No results found.  Medications: I have reviewed the patient's current medications.  Assessment: Acute alcoholic hepatitis. Encephalopathy Anemia Coagulopathy CT from 06/17/18 showed small volume ascites, possible changes of cirrhosis   Plan: Ultrasound of the  abdomen to evaluate volume of ascitic fluid(has been ordered), drainage thereafter if needed Continue folic acid, multivitamin and thiamine On Xifaxan for encephalopathy-has not improved, we'll start lactulose   Ronnette Juniper 06/25/2018, 11:46 AM   Pager [phone] If no answer or after 5 PM call [phone]

## 2018-06-26 ENCOUNTER — Inpatient Hospital Stay (HOSPITAL_COMMUNITY): Payer: 59

## 2018-06-26 DIAGNOSIS — K7031 Alcoholic cirrhosis of liver with ascites: Secondary | ICD-10-CM

## 2018-06-26 DIAGNOSIS — E785 Hyperlipidemia, unspecified: Secondary | ICD-10-CM

## 2018-06-26 DIAGNOSIS — F10231 Alcohol dependence with withdrawal delirium: Principal | ICD-10-CM

## 2018-06-26 DIAGNOSIS — I10 Essential (primary) hypertension: Secondary | ICD-10-CM

## 2018-06-26 DIAGNOSIS — F10931 Alcohol use, unspecified with withdrawal delirium: Secondary | ICD-10-CM

## 2018-06-26 DIAGNOSIS — R188 Other ascites: Secondary | ICD-10-CM

## 2018-06-26 LAB — PROTEIN, PLEURAL OR PERITONEAL FLUID

## 2018-06-26 LAB — BODY FLUID CELL COUNT WITH DIFFERENTIAL
EOS FL: 0 %
LYMPHS FL: 15 %
MONOCYTE-MACROPHAGE-SEROUS FLUID: 84 % (ref 50–90)
Neutrophil Count, Fluid: 1 % (ref 0–25)
WBC FLUID: 163 uL (ref 0–1000)

## 2018-06-26 LAB — GLUCOSE, PLEURAL OR PERITONEAL FLUID: Glucose, Fluid: 138 mg/dL

## 2018-06-26 LAB — LACTATE DEHYDROGENASE, PLEURAL OR PERITONEAL FLUID: LD, Fluid: 47 U/L — ABNORMAL HIGH (ref 3–23)

## 2018-06-26 LAB — GRAM STAIN

## 2018-06-26 LAB — ALBUMIN, PLEURAL OR PERITONEAL FLUID: Albumin, Fluid: <1 g/dL

## 2018-06-26 MED ORDER — FUROSEMIDE 20 MG PO TABS
20.0000 mg | ORAL_TABLET | Freq: Every day | ORAL | Status: DC
  Administered 2018-06-26 – 2018-06-27 (×2): 20 mg via ORAL
  Filled 2018-06-26 (×2): qty 1

## 2018-06-26 MED ORDER — SPIRONOLACTONE 25 MG PO TABS
50.0000 mg | ORAL_TABLET | Freq: Every day | ORAL | Status: DC
  Administered 2018-06-26 – 2018-06-28 (×3): 50 mg via ORAL
  Filled 2018-06-26 (×3): qty 2

## 2018-06-26 MED ORDER — LIDOCAINE HCL (PF) 1 % IJ SOLN
INTRAMUSCULAR | Status: AC
  Administered 2018-06-26: 13:00:00
  Filled 2018-06-26: qty 30

## 2018-06-26 NOTE — Procedures (Signed)
PROCEDURE SUMMARY:  Successful image-guided paracentesis from the left lateral abdomen.  Yielded 1.0 liter of clear yellow fluid.  No immediate complications.  Patient tolerated well.   Specimen was sent for labs.  Please see imaging section in Epic for full dictation.  Villa Herb PA-C 06/26/2018 11:45 AM

## 2018-06-27 DIAGNOSIS — R7401 Elevation of levels of liver transaminase levels: Secondary | ICD-10-CM

## 2018-06-27 DIAGNOSIS — R740 Nonspecific elevation of levels of transaminase and lactic acid dehydrogenase [LDH]: Secondary | ICD-10-CM

## 2018-06-27 DIAGNOSIS — D649 Anemia, unspecified: Secondary | ICD-10-CM

## 2018-06-27 DIAGNOSIS — I10 Essential (primary) hypertension: Secondary | ICD-10-CM

## 2018-06-27 DIAGNOSIS — D696 Thrombocytopenia, unspecified: Secondary | ICD-10-CM

## 2018-06-27 DIAGNOSIS — D539 Nutritional anemia, unspecified: Secondary | ICD-10-CM

## 2018-06-27 LAB — CBC
HEMATOCRIT: 27.4 % — AB (ref 39.0–52.0)
HEMOGLOBIN: 9.3 g/dL — AB (ref 13.0–17.0)
MCH: 36.8 pg — ABNORMAL HIGH (ref 26.0–34.0)
MCHC: 33.9 g/dL (ref 30.0–36.0)
MCV: 108.3 fL — ABNORMAL HIGH (ref 80.0–100.0)
Platelets: 129 10*3/uL — ABNORMAL LOW (ref 150–400)
RBC: 2.53 MIL/uL — AB (ref 4.22–5.81)
RDW: 17.4 % — ABNORMAL HIGH (ref 11.5–15.5)
WBC: 6.7 10*3/uL (ref 4.0–10.5)
nRBC: 0 % (ref 0.0–0.2)

## 2018-06-27 LAB — COMPREHENSIVE METABOLIC PANEL
ALBUMIN: 1.6 g/dL — AB (ref 3.5–5.0)
ALK PHOS: 57 U/L (ref 38–126)
ALT: 39 U/L (ref 0–44)
AST: 74 U/L — AB (ref 15–41)
Anion gap: 5 (ref 5–15)
BILIRUBIN TOTAL: 4.7 mg/dL — AB (ref 0.3–1.2)
BUN: 8 mg/dL (ref 6–20)
CALCIUM: 7.7 mg/dL — AB (ref 8.9–10.3)
CO2: 29 mmol/L (ref 22–32)
Chloride: 103 mmol/L (ref 98–111)
Creatinine, Ser: 0.75 mg/dL (ref 0.61–1.24)
GFR calc Af Amer: >60 mL/min (ref >60)
GFR calc non Af Amer: >60 mL/min (ref >60)
GLUCOSE: 133 mg/dL — AB (ref 70–99)
Potassium: 3.5 mmol/L (ref 3.5–5.1)
SODIUM: 137 mmol/L (ref 135–145)
TOTAL PROTEIN: 6.8 g/dL (ref 6.5–8.1)

## 2018-06-27 LAB — TRIGLYCERIDES, BODY FLUIDS: Triglycerides, Fluid: 39 mg/dL

## 2018-06-27 LAB — PATHOLOGIST SMEAR REVIEW: Path Review: REACTIVE

## 2018-06-27 LAB — LACTATE DEHYDROGENASE: LDH: 196 U/L — ABNORMAL HIGH (ref 98–192)

## 2018-06-27 MED ORDER — LACTULOSE 10 GM/15ML PO SOLN
20.0000 g | Freq: Two times a day (BID) | ORAL | Status: DC
  Administered 2018-06-27 – 2018-06-30 (×7): 20 g via ORAL
  Filled 2018-06-27 (×6): qty 30

## 2018-06-27 MED ORDER — NON FORMULARY: 5.0000 mg | Freq: Every evening | Status: DC | PRN

## 2018-06-27 MED ORDER — LORAZEPAM 1 MG PO TABS: 2.0000 mg | ORAL_TABLET | Freq: Four times a day (QID) | ORAL | Status: DC | PRN

## 2018-06-27 MED ORDER — FUROSEMIDE 20 MG PO TABS
20.0000 mg | ORAL_TABLET | Freq: Two times a day (BID) | ORAL | Status: DC
  Administered 2018-06-27 – 2018-06-28 (×2): 20 mg via ORAL
  Filled 2018-06-27 (×2): qty 1

## 2018-06-27 MED ORDER — LORAZEPAM 2 MG/ML IJ SOLN: 2.0000 mg | Freq: Four times a day (QID) | INTRAMUSCULAR | Status: DC | PRN

## 2018-06-27 MED ORDER — SENNA 8.6 MG PO TABS
1.0000 | ORAL_TABLET | Freq: Every day | ORAL | Status: DC | PRN
  Administered 2018-06-27 – 2018-06-28 (×2): 8.6 mg via ORAL
  Filled 2018-06-27 (×2): qty 1

## 2018-06-27 MED ORDER — ALUM & MAG HYDROXIDE-SIMETH 200-200-20 MG/5ML PO SUSP
30.0000 mL | ORAL | Status: DC | PRN
  Administered 2018-06-27: 30 mL via ORAL
  Filled 2018-06-27: qty 30

## 2018-06-27 MED ORDER — MELATONIN 3 MG PO TABS
4.5000 mg | ORAL_TABLET | Freq: Every evening | ORAL | Status: DC | PRN
  Administered 2018-06-27: 4.5 mg via ORAL
  Filled 2018-06-27 (×2): qty 1.5

## 2018-06-27 NOTE — Progress Notes (Signed)
Physical Therapy Treatment Patient Details Name: Alfred Kelley MRN: [id number] DOB: 1955/10/21 Today's Date: 06/27/2018    History of Present Illness 62 y.o. male with past medical history of alcohol abuse brought into the hospital by brother for evaluation of jaundice and confusion yesterday.  Upon initial evaluation, he was found to have jaundice with total bilirubin of 12.8, AST 154, ALT 85, normal alkaline phosphatase.  INR 1.98.  CBC showed mild leukocytosis with WBC 13.0,  hemoglobin of 11.9 and platelet count of 118.  CT abdomen pelvis with IV contrast showed heterogenicity of the liver parenchyma and small volume ascites with differential of cirrhosis and possible concurrent acute hepatitis.    PT Comments    PT session today focusing on progressing safe and independent functional mobility. Patient with family present who is very supportive. Requires up to Min A for transfers and mobility due to decreased balance, safety awareness, and some uncoordinated LE movements with gait. Requires consistent verbal cueing for safety with mobility for RW navigation and with turning. Easily distractible increasing patients fall risk. Will continue to recommend CIR to progress safe and independent mobility prior to return home.    Follow Up Recommendations  CIR;Supervision/Assistance - 24 hour     Equipment Recommendations  None recommended by PT(defer)    Recommendations for Other Services Rehab consult;OT consult     Precautions / Restrictions Precautions Precautions: Fall Restrictions Weight Bearing Restrictions: No    Mobility  Bed Mobility Overal bed mobility: Needs Assistance Bed Mobility: Supine to Sit     Supine to sit: Min assist;Min guard     General bed mobility comments: increased time and effort; hands on to untangle from bed sheets; limited safety awareness increasing fall risk  Transfers Overall transfer level: Needs assistance Equipment used: Rolling walker (2  wheeled) Transfers: Sit to/from Stand Sit to Stand: Min guard         General transfer comment: close min guard for safety and immediate standing balance; wide BOS with unsteadiness with bringing LE closer together. tends to posterior weight shift  Ambulation/Gait Ambulation/Gait assistance: Min guard;Min assist Gait Distance (Feet): (hallway ambulation) Assistive device: Rolling walker (2 wheeled) Gait Pattern/deviations: Step-through pattern;Decreased stride length;Drifts right/left;Trunk flexed Gait velocity: varying   General Gait Details: unsteady gait pattern with seemingly discoordinated LE movements and spacial difficulties when placing foot. requires consistent verbal cueing for safety and obstacle navigation. easily distractible; up to Min A with turning and navigating   Stairs             Wheelchair Mobility    Modified Rankin (Stroke Patients Only)       Balance Overall balance assessment: Needs assistance Sitting-balance support: No upper extremity supported;Feet supported Sitting balance-Leahy Scale: Fair     Standing balance support: Bilateral upper extremity supported;During functional activity Standing balance-Leahy Scale: Poor Standing balance comment: still needing external support                            Cognition Arousal/Alertness: Awake/alert Behavior During Therapy: Restless;Impulsive Overall Cognitive Status: Impaired/Different from baseline Area of Impairment: Memory;Following commands;Safety/judgement;Problem solving                     Memory: Decreased recall of precautions;Decreased short-term memory Following Commands: Follows one step commands with increased time Safety/Judgement: Decreased awareness of safety;Decreased awareness of deficits   Problem Solving: Slow processing;Difficulty sequencing;Requires verbal cues;Requires tactile cues General Comments: jokes quite a bit  during conversation       Exercises      General Comments General comments (skin integrity, edema, etc.): family present and very supportive      Pertinent Vitals/Pain Pain Assessment: No/denies pain    Home Living                      Prior Function            PT Goals (current goals can now be found in the care plan section) Acute Rehab PT Goals Patient Stated Goal: not stated PT Goal Formulation: With patient/family Time For Goal Achievement: 07/02/18 Potential to Achieve Goals: Good Progress towards PT goals: Progressing toward goals    Frequency    Min 3X/week      PT Plan Current plan remains appropriate    Co-evaluation              AM-PAC PT "6 Clicks" Daily Activity  Outcome Measure  Difficulty turning over in bed (including adjusting bedclothes, sheets and blankets)?: A Little Difficulty moving from lying on back to sitting on the side of the bed? : A Little Difficulty sitting down on and standing up from a chair with arms (e.g., wheelchair, bedside commode, etc,.)?: Unable Help needed moving to and from a bed to chair (including a wheelchair)?: A Little Help needed walking in hospital room?: A Little Help needed climbing 3-5 steps with a railing? : A Little 6 Click Score: 16    End of Session Equipment Utilized During Treatment: Gait belt Activity Tolerance: Patient tolerated treatment well Patient left: in bed;with call bell/phone within reach;with family/visitor present Nurse Communication: Mobility status PT Visit Diagnosis: Unsteadiness on feet (R26.81)     Time: 1610-9604 PT Time Calculation (min) (ACUTE ONLY): 18 min  Charges:  $Gait Training: 8-22 mins                     Kipp Laurence, PT, DPT Supplemental Physical Therapist 06/27/18 1:43 PM Pager: [phone] Office: [phone]

## 2018-06-27 NOTE — Progress Notes (Signed)
[facility] Gastroenterology Progress Note  Alfred Kelley 62 y.o. Mar 04, 1956   Subjective: Sitting up in bed eating breakfast. Complains of abdominal distention. Reports large BM overnight.  Objective: Vital signs: Vitals:   06/27/18 0025 06/27/18 0631  BP: 103/70 126/80  Pulse: (!) 109 (!) 102  Resp: 16 14  Temp: 98.2 F (36.8 C) 98.2 F (36.8 C)  SpO2: 95% 96%    Physical Exam: Gen: lethargic, no acute distress  HEENT: anicteric sclera CV: RRR Chest: CTA B Abd: distended, nontender, decreased BS Ext: no edema Neuro: no asterixis  Lab Results: Recent Labs    06/25/18 0503 06/27/18 0329  NA 138 137  K 3.5 3.5  CL 105 103  CO2 29 29  GLUCOSE 119* 133*  BUN 6 8  CREATININE 0.83 0.75  CALCIUM 8.1* 7.7*   Recent Labs    06/27/18 0329  AST 74*  ALT 39  ALKPHOS 57  BILITOT 4.7*  PROT 6.8  ALBUMIN 1.6*   Recent Labs    06/27/18 0329  WBC 6.7  HGB 9.3*  HCT 27.4*  MCV 108.3*  PLT 129*      Assessment/Plan: Hepatic encephalopathy that is resolving. 1 L removed on paracentesis yesterday negative for SBP. Consider therapeutic paracentesis if diuretics do not improve distention. Continue Lactulose but will increase dose and long-term needs titration of Lactulose dose to have 2-3 soft stools per day. Low sodium diet. Supportive care. Will sign off. Call if questions.   Shirley Friar 06/27/2018, 9:29 AM  Questions please call [phone] ID: Alfred Kelley, male   DOB: [birth year], 62 y.o.   MRN: [id number]

## 2018-06-27 NOTE — Consult Note (Signed)
Physical Medicine and Rehabilitation Consult Reason for Consult: Decreased functional mobility Referring Physician: Family medicine   HPI: Alfred Kelley is a 62 y.o.right handed male  with history of hyperlipidemia, hypertension, alcohol abuse. Per chart review and brother, patient lives alone in [place]. Independent prior to admission. Plans to stay with his brother on discharge in the [place] area. Presented 11 09/14/2016 with altered mental status and jaundice as well as nonspecific abdominal pain. Patient with weight loss of 60 pounds over the last 4 months. UDS negative,sodium 126, potassium 3.3,ammonia level 31,alcohol level less than 10,total bilirubin 12.8, AST 154, ALT 85,urine culture insignificant growth, lactic acid 4.53. Chest x-ray negative.CT of abdomen and pelvis small volume ascites.Patient placed on alcohol withdrawal protocol. Underwent paracentesis left lateral abdomen yielding 1.0 L of clear yellow fluid. Subcutaneous Lovenox for DVT prophylaxis.sodium levels have rebounded 137. Acute on chronic anemia 9.3 as well as platelets 129,000. Physical therapy note recommendations for physical medicine rehabilitation consult.  Review of Systems  Constitutional: Positive for weight loss. Negative for fever.  HENT: Negative for hearing loss.   Eyes: Negative for blurred vision and double vision.  Respiratory: Positive for shortness of breath. Negative for cough.   Cardiovascular: Negative for chest pain and palpitations.  Gastrointestinal: Positive for constipation and nausea. Negative for vomiting.  Genitourinary: Negative for dysuria, flank pain and hematuria.  Musculoskeletal: Positive for joint pain and myalgias.  Skin: Negative for rash.  Psychiatric/Behavioral: Positive for depression.  All other systems reviewed and are negative.  Past Medical History:  Diagnosis Date  . Alcoholism (HCC)   . Hyperlipidemia   . Hypertension   . Kidney stones      No pertinent surgical history, related to abdomen. No pertinent family history of Etoh abuse. Social History:  reports that he has never smoked. He has never used smokeless tobacco. He reports that he drinks about 5.0 standard drinks of alcohol per week. He reports that he does not use drugs. Allergies: No Known Allergies Medications Prior to Admission  Medication Sig Dispense Refill  . lisinopril-hydrochlorothiazide (PRINZIDE,ZESTORETIC) 10-12.5 MG tablet Take 1 tablet by mouth daily.  0  . simvastatin (ZOCOR) 40 MG tablet Take 40 mg by mouth daily.   5    Home: Home Living Family/patient expects to be discharged to:: Private residence Living Arrangements: Alone Available Help at Discharge: Family, Available 24 hours/day Type of Home: House  Functional History: Prior Function Level of Independence: Independent Comments: Pt lives alone at baseline in [place]. Plan is for d/c home with brother in [place] upon d/c.  Functional Status:  Mobility: Bed Mobility Overal bed mobility: Needs Assistance Bed Mobility: Supine to Sit Supine to sit: Min assist Sit to supine: Min guard General bed mobility comments: slow and mildly effortful, but stability support only Transfers Overall transfer level: Needs assistance Equipment used: Rolling walker (2 wheeled) Transfers: Sit to/from Stand Sit to Stand: Min assist Stand pivot transfers: Min assist General transfer comment: stability assist, especially standign up on the safety matting.  cues for hand placement Ambulation/Gait Ambulation/Gait assistance: Supervision, Min assist, Mod assist(mod on 2 occasions where pt turned to look at family and los) Educational psychologist (Feet): 120 Feet Assistive device: Rolling walker (2 wheeled) Gait Pattern/deviations: Step-through pattern General Gait Details: mildly unsteady gait overall, but more unsteady with loss of focus, scanning and when runs into stationary objects on the right.  He is  frequently unfocused and ran into obstacles x6 on the right.  Gait velocity: pt able to speed up significantly, but unable to control more than a steady moderate speed. Gait velocity interpretation: 1.31 - 2.62 ft/sec, indicative of limited community ambulator    ADL:    Cognition: Cognition Overall Cognitive Status: Impaired/Different from baseline Orientation Level: Oriented to person, Disoriented to place, Disoriented to time, Disoriented to situation Cognition Arousal/Alertness: Awake/alert Behavior During Therapy: Impulsive, Restless Overall Cognitive Status: Impaired/Different from baseline Area of Impairment: Orientation, Attention, Memory, Following commands, Safety/judgement, Awareness, Problem solving Orientation Level: Situation, Time Current Attention Level: Sustained Memory: Decreased short-term memory Following Commands: Follows one step commands with increased time Safety/Judgement: Decreased awareness of safety Awareness: Intellectual Problem Solving: Slow processing, Difficulty sequencing, Requires verbal cues, Requires tactile cues General Comments: pt's behaviors are a little labile, but generally joking and (life of the party like), in attempt to hide some of his confusion.  Blood pressure 103/70, pulse (!) 109, temperature 98.2 F (36.8 C), temperature source Oral, resp. rate 16, height 5\' 9"  (1.753 m), weight 79 kg, SpO2 95 %. Physical Exam  Vitals reviewed. Constitutional: He appears well-developed.  Obese  HENT:  Head: Normocephalic and atraumatic.  Eyes: EOM are normal. Right eye exhibits no discharge. Left eye exhibits no discharge.  Pupils reactive to light  Neck: Normal range of motion. Neck supple. No thyromegaly present.  Cardiovascular: Regular rhythm.  +Tachycardia  Respiratory: Effort normal and breath sounds normal. No respiratory distress.  GI: Soft. Bowel sounds are normal. He exhibits distension.  Musculoskeletal:  No edema or tenderness  in extremities  Neurological: He is alert.  Patient is alert He does answer simple questions appropriately.  Motor: 4+/5 grossly throughout Sensation intact light touch  Skin: Skin is warm and dry.  Psychiatric: His mood appears anxious. His speech is tangential.   Results for orders placed or performed during the hospital encounter of 06/17/18 (from the past 24 hour(s))  Lactate dehydrogenase (pleural or peritoneal fluid)     Status: Abnormal   Collection Time: 06/26/18 12:01 PM  Result Value Ref Range   LD, Fluid 47 (H) 3 - 23 U/L   Fluid Type-FLDH Peritoneal   Triglycerides, Body Fluid     Status: None   Collection Time: 06/26/18 12:01 PM  Result Value Ref Range   Triglycerides, Fluid 39 Not Estab. mg/dL   Fluid Type-FTRIG Peritoneal   Body fluid cell count with differential     Status: Abnormal   Collection Time: 06/26/18 12:01 PM  Result Value Ref Range   Fluid Type-FCT Peritoneal    Color, Fluid YELLOW (A) YELLOW   Appearance, Fluid HAZY (A) CLEAR   WBC, Fluid 163 0 - 1,000 cu mm   Neutrophil Count, Fluid 1 0 - 25 %   Lymphs, Fluid 15 %   Monocyte-Macrophage-Serous Fluid 84 50 - 90 %   Eos, Fluid 0 %  Albumin, pleural or peritoneal fluid     Status: None   Collection Time: 06/26/18 12:01 PM  Result Value Ref Range   Albumin, Fluid <1.0 g/dL   Fluid Type-FALB Peritoneal   Protein, pleural or peritoneal fluid     Status: None   Collection Time: 06/26/18 12:01 PM  Result Value Ref Range   Total protein, fluid <3.0 g/dL   Fluid Type-FTP Peritoneal   Glucose, pleural or peritoneal fluid     Status: None   Collection Time: 06/26/18 12:01 PM  Result Value Ref Range   Glucose, Fluid 138 mg/dL   Fluid Type-FGLU Peritoneal   Gram stain  Status: None   Collection Time: 06/26/18 12:02 PM  Result Value Ref Range   Specimen Description PERITONEAL    Special Requests NONE    Gram Stain      WBC PRESENT,BOTH PMN AND MONONUCLEAR NO ORGANISMS SEEN CYTOSPIN  SMEAR Performed at [facility], [address], [place], [place] [address]    Report Status 06/26/2018 FINAL   CBC     Status: Abnormal   Collection Time: 06/27/18  3:29 AM  Result Value Ref Range   WBC 6.7 4.0 - 10.5 K/uL   RBC 2.53 (L) 4.22 - 5.81 MIL/uL   Hemoglobin 9.3 (L) 13.0 - 17.0 g/dL   HCT 60.4 (L) 54.0 - 98.1 %   MCV 108.3 (H) 80.0 - 100.0 fL   MCH 36.8 (H) 26.0 - 34.0 pg   MCHC 33.9 30.0 - 36.0 g/dL   RDW 19.1 (H) 47.8 - 29.5 %   Platelets 129 (L) 150 - 400 K/uL   nRBC 0.0 0.0 - 0.2 %  Comprehensive metabolic panel     Status: Abnormal   Collection Time: 06/27/18  3:29 AM  Result Value Ref Range   Sodium 137 135 - 145 mmol/L   Potassium 3.5 3.5 - 5.1 mmol/L   Chloride 103 98 - 111 mmol/L   CO2 29 22 - 32 mmol/L   Glucose, Bld 133 (H) 70 - 99 mg/dL   BUN 8 6 - 20 mg/dL   Creatinine, Ser 6.21 0.61 - 1.24 mg/dL   Calcium 7.7 (L) 8.9 - 10.3 mg/dL   Total Protein 6.8 6.5 - 8.1 g/dL   Albumin 1.6 (L) 3.5 - 5.0 g/dL   AST 74 (H) 15 - 41 U/L   ALT 39 0 - 44 U/L   Alkaline Phosphatase 57 38 - 126 U/L   Total Bilirubin 4.7 (H) 0.3 - 1.2 mg/dL   GFR calc non Af Amer >60 >60 mL/min   GFR calc Af Amer >60 >60 mL/min   Anion gap 5 5 - 15   US Paracentesis  Result Date: 06/26/2018 INDICATION: History of ETOH cirrhosis, acute liver failure, ETOH withdrawal. New onset ascites - request for diagnostic and therapeutic paracentesis. EXAM: ULTRASOUND GUIDED DIAGNOSTIC AND THERAPEUTIC PARACENTESIS MEDICATIONS: 10 mL 1% lidocaine. COMPLICATIONS: None immediate. PROCEDURE: Informed written consent was obtained from the patient after a discussion of the risks, benefits and alternatives to treatment. A timeout was performed prior to the initiation of the procedure. Initial ultrasound scanning demonstrates a small amount of ascites within the left lower abdominal quadrant. The left lower abdomen was prepped and draped in the usual sterile fashion. 1% lidocaine was used for local  anesthesia. Following this, a 6 Fr Safe-T-Centesis catheter was introduced. An ultrasound image was saved for documentation purposes. The paracentesis was performed. The catheter was removed and a dressing was applied. The patient tolerated the procedure well without immediate post procedural complication. FINDINGS: A total of approximately 1.0 L of clear yellow fluid was removed. Samples were sent to the laboratory as requested by the clinical team. IMPRESSION: Successful ultrasound-guided paracentesis yielding 1.0 liters of peritoneal fluid. Read by Lynnette Caffey, PA-C Electronically Signed   By: Malachy Moan M.D.   On: 06/26/2018 12:04   [place] Ascites (abdomen Limited)  Result Date: 06/25/2018 CLINICAL DATA:  Enlarged abdomen.  History of alcoholism. EXAM: LIMITED ABDOMEN ULTRASOUND FOR ASCITES TECHNIQUE: Limited ultrasound survey for ascites was performed in all four abdominal quadrants. COMPARISON:  CT abdomen and pelvis 06/17/2018. FINDINGS: Moderate ascites  is observed, bilaterally. Fluid accumulation is greatest in the RIGHT lower quadrant and LEFT lower quadrant. IMPRESSION: Moderate ascites is demonstrated sonographically, and confirms the CT finding of ascites of 06/17/2018. Electronically Signed   By: Elsie Stain M.D.   On: 06/25/2018 15:09    Assessment/Plan: Diagnosis: Hepatic encephalopathy Labs and images (see above) independently reviewed.  Records reviewed and summated above.  1. Does the need for close, 24 hr/day medical supervision in concert with the patient's rehab needs make it unreasonable for this patient to be served in a less intensive setting? Potentially  2. Co-Morbidities requiring supervision/potential complications: Acute on chronic macrocytic anemia (repeat labs, transfuse to ensure appropriate perfusion for increased activity tolerance), hyperlipidemia (cont meds), HTN (monitor and provide prns in accordance with increased physical exertion and pain), alcohol  abuse (CIWA, counsel), transaminitis (avoid hepatotoxic meds), thrombocytopenia 3. Due to safety, disease management and patient education, does the patient require 24 hr/day rehab nursing? Potentially 4. Does the patient require coordinated care of a physician, rehab nurse, PT (1-2 hrs/day, 5 days/week), OT (1-2 hrs/day, 5 days/week) and SLP (1-2 hrs/day, 5 days/week) to address physical and functional deficits in the context of the above medical diagnosis(es)? Potentially Addressing deficits in the following areas: balance, endurance, locomotion, strength, transferring, bathing, dressing, cognition and psychosocial support 5. Can the patient actively participate in an intensive therapy program of at least 3 hrs of therapy per day at least 5 days per week? Yes 6. The potential for patient to make measurable gains while on inpatient rehab is excellent 7. Anticipated functional outcomes upon discharge from inpatient rehab are supervision  with PT, supervision with OT, modified independent and supervision with SLP. 8. Estimated rehab length of stay to reach the above functional goals is: 5-9 days. 9. Anticipated D/C setting: Home 10. Anticipated post D/C treatments: HH therapy and Home excercise program 11. Overall Rehab/Functional Prognosis: good  RECOMMENDATIONS: This patient's condition is appropriate for continued rehabilitative care in the following setting: Inconsistent activity level in therapies.  Recommend PT reeval as well OT eval. Patient has agreed to participate in recommended program. Yes Note that insurance prior authorization may be required for reimbursement for recommended care.  Comment: Rehab Admissions Coordinator to follow up.   I have personally performed a face to face diagnostic evaluation, including, but not limited to relevant history and physical exam findings, of this patient and developed relevant assessment and plan.  Additionally, I have reviewed and concur with the  physician assistant's documentation above.   Maryla Morrow, MD, ABPMR Alfred Rossetti Angiulli, PA-C 06/27/2018

## 2018-06-27 NOTE — Progress Notes (Signed)
Family Medicine Teaching Service Daily Progress Note Intern Pager: [phone]  Patient name: Alfred Kelley Medical record number: [id number] Date of birth: 1956/03/27 Age: 62 y.o. Gender: male  Primary Care Provider: Patient, No Pcp Per Consultants: GI, PT Code Status: Full code  Pt Overview and Major Events to Date:  Admitted: 06/17/2018 Hospital Day: 11   Assessment and Plan: Alfred Kelley is a 62 y.o. male who presented with jaundice, abdominal pain, confusion concerning for acute liver failure. PMHx is significant for essential hypertension, hyperlipidemia and alcohol use disorder.  #Delirium tremens, stable, improving Most recent CIWA was 4. No PRN ativan in the last 24 hour.   Continue CIWA protocol, checking every 6 hours  Changing Ativan 2 grams to every 6 hours as needed if CIWA > 8  Continue thiamine, multivitamins, folate  Appreciate case management assistance and recommendations for disposition.  Patient can be discharged when symptom-free for 48 hours  Continue rifaximin 550 mg twice daily  Continue lactulose 10 g daily  #Abdominal distention  Patient continues to complains of abdominal fullness.  He reports that he felt better after 1 L taken off for paracentesis.  Today he reports that he is still feeling bloated.  And he would like to attempt another paracentesis.  He also reports having to large bowel movements yesterday.  He was not able to make it to the bathroom for 1.  Status post 1 L paracentesis  Continue to follow patient symptoms  Change senna from scheduled as needed  Volume overload Patient has crackles bilaterally on exam today.  He is currently taking 20 mg of Lasix and 50 mg Aldactone daily.  Patient does not report shortness of breath.  Increase Lasix 20 mg to twice daily.  Acute liver failure, stable, improving Labs continue to improve.  AST is 74, ALT within normal limits.  Total bili remains elevated at 4.7.  albumin at 1.6.  Daily  physical exam, asterixis, mental status  Limit acetaminophen  Appreciate GI recommendations as they continue to follow  Hypertension, chronic, stable Blood pressures have been stable without medication  Holding home lisinopril hydrochlorothiazide  Hyperlipidemia  Can restart atorvastatin 40 mg  Most recent lipid panel within normal limits  We will continue to follow outpatient  Macrocytic anemia with thrombocytopenia likely secondary to alcohol related acute liver failure Hemoglobin today is 9.3.  Hemoglobin remained stable MCV is 108.3.  We will hold on CBC tomorrow.  Check CBC with differential and INR prior to expected discharge  Patient will need follow-up outpatient  Continue multivitamins, folate, thiamine  GERD  Tums as needed  Protein Calorie Malnutrition  Continue Ensure live twice daily between meals  Hospital problems resolved Hyponatremia: BMP prior to discharge with outpatient follow-up.  1500 mL fluid restriction daily. Hypokalemia: BMP prior to discharge with outpatient follow-up Hematuria: Will need outpatient follow-up for recommended cystoscopy in setting of history of frequent nephrolithiasis.  Microscopic hematuria likely exacerbated by bili from liver failure.  Electrolytes: Replete PRN Nutrition: Heart healthy with fluid restriction 1500 mL GI ppx: Tums, senna 1 tablet twice daily DVT ppx: Lovenox 40 mg every 24 hours  Future labs: No labs for tomorrow morning Disposition: location to be decided.  Patient will have to be asymptomatic for 48 hours.  PRN Meds: sodium chloride, acetaminophen **OR** acetaminophen, calcium carbonate, LORazepam **OR** LORazepam, ondansetron **OR** ondansetron (ZOFRAN) IV  ================================================= ================================================= Subjective:  Patient reports doing well overnight.  Patient's brother is at bedside this morning.  Patient reports that  his belly felt better  after the paracentesis yesterday.  He still reports that he is feeling full in his abdomen and requests another paracentesis if needed.   He has not required PRN Ativan in 48 hours  Objective: Vital Signs Temp:  [98.2 F (36.8 C)-98.5 F (36.9 C)] 98.2 F (36.8 C) (11/11 0025) Pulse Rate:  [105-109] 109 (11/11 0025) Resp:  [16] 16 (11/11 0025) BP: (103-131)/(70-83) 103/70 (11/11 0025) SpO2:  [95 %-98 %] 95 % (11/11 0025)  Intake/Output 11/10 0701 - 11/11 0700 In: 1183 [P.O.:1180; I.V.:3] Out: -   Physical Exam:  Gen: NAD, alert, non-toxic, well-appearing, sitting comfortably in chair.  Skin: Warm and dry. No obvious rashes, lesions, or trauma. HEENT: NCAT No conjunctival pallor or injection. MMM.  CV: Regular and tachycardic.  Normal S1-S2. RP 2+ bilaterally. No BLEE. Resp: CTAB.  No wheezing, rales, abnormal lung sounds.  No increased WOB Abd: Positive bowel sounds.  Abdomen less distended less firm on palpation.  Continues to be tympanitic to percussion. Psych: Cooperative with exam. Pleasant. Makes eye contact. Speech is improved and patient is able to speak more fluently with better mentation.  Psychomotor slowing is also improved. Extremities: Moves all extremities spontaneously.  No tremor appreciated today.  No asterixis appreciated.   Neuro: CN II-XII grossly intact. No FNDs.   Laboratory: Recent Labs  Lab 06/22/18 0329 06/23/18 0435 06/27/18 0329  WBC 9.2 8.0 6.7  HGB 9.5* 9.5* 9.3*  HCT 27.1* 27.9* 27.4*  PLT 121* 118* 129*   Recent Labs  Lab 06/22/18 0329 06/23/18 0435 06/25/18 0503 06/27/18 0329  NA 135 135 138 137  K 3.6 3.5 3.5 3.5  CL 102 102 105 103  CO2 31 24 29 29   BUN 8 8 6 8   CREATININE 0.73 0.67 0.83 0.75  CALCIUM 8.2* 8.1* 8.1* 7.7*  PROT 6.8 6.7  --  6.8  BILITOT 6.4* 6.0*  --  4.7*  ALKPHOS 61 57  --  57  ALT 51* 48*  --  39  AST 94* 87*  --  74*  GLUCOSE 107* 104* 119* 133*    Imaging/Diagnostic Tests: US Paracentesis IMPRESSION:  Successful ultrasound-guided paracentesis yielding 1.0 liters of peritoneal fluid.   [place] Ascites (abdomen Limited)  IMPRESSION: Moderate ascites is demonstrated sonographically, and confirms the CT finding of ascites of 06/17/2018. Electronically Signed   By: Elsie Stain M.D.   On: 06/25/2018 15:09    Melene Plan, MD 06/27/2018, 9:07 AM PGY-1, [facility] FPTS Intern pager: [phone], text pages welcome

## 2018-06-27 NOTE — Progress Notes (Addendum)
Inpatient Rehabilitation Admissions Coordinator  I have written orders for OT and SLP eval. Await therapy assessments to assist with planning dispo needs. Rehab venue dispo will depend on these assessments. I met with patient, his brother and Dad at bedside. We discussed goals and expectations of an inpt rehab admit. They prefer inpt rehab . I will begin insurance authorization once all evals are complete. I will follow up tomorrow.  Danne Baxter, RN, MSN Rehab Admissions Coordinator [phone] 06/27/2018 12:28 PM

## 2018-06-28 LAB — LIPASE, FLUID: LIPASE-FLUID: 101 U/L

## 2018-06-28 MED ORDER — SPIRONOLACTONE 25 MG PO TABS
100.0000 mg | ORAL_TABLET | Freq: Two times a day (BID) | ORAL | Status: DC
  Administered 2018-06-28 – 2018-06-30 (×4): 100 mg via ORAL
  Filled 2018-06-28 (×4): qty 4

## 2018-06-28 MED ORDER — TORSEMIDE 20 MG PO TABS
20.0000 mg | ORAL_TABLET | Freq: Two times a day (BID) | ORAL | Status: DC
  Administered 2018-06-28 – 2018-06-30 (×4): 20 mg via ORAL
  Filled 2018-06-28 (×4): qty 1

## 2018-06-28 NOTE — Evaluation (Signed)
Speech Language Pathology Evaluation Patient Details Name: Alfred Kelley MRN: [id number] DOB: [birth year] Today's Date: 06/28/2018 Time: 4098-1191 SLP Time Calculation (min) (ACUTE ONLY): 63 min  Problem List:  Patient Active Problem List   Diagnosis Date Noted  . Acute on chronic anemia   . Macrocytic anemia   . Benign essential HTN   . Transaminitis   . Thrombocytopenia (HCC)   . Ascites   . Alcohol withdrawal syndrome, with delirium (HCC)   . Essential hypertension   . Hyperlipidemia   . Abdomen enlarged   . Acute liver failure 06/18/2018  . Alcohol abuse   . Liver dysfunction 06/17/2018  . Altered mental status   . Hyperbilirubinemia   . Hyponatremia    Past Medical History:  Past Medical History:  Diagnosis Date  . Alcoholism (HCC)   . Hyperlipidemia   . Hypertension   . Kidney stones    Past Surgical History: History reviewed. No pertinent surgical history. HPI:  62 yo male adm to [facility] with abdominal pain - jaundice and confusion.  Pt found to have liver failure.  Pt is s/p paracentesis.  Pt has h/o ETOH use, HTN, Hyperlipidemia.  Speech evaluation ordered as pt for possible CIR plan.  Per teaching service, pt with improved speech/mentation during hospital coarse.     Assessment / Plan / Recommendation Clinical Impression  Patient presents with severe cognitive deficits (MOCA score was 15/30- with 26 or above normal) mostly impacting memory and attention.   He demonstrates poor awareness to these cognitive deficits and attempts to cover with verbosity and reasoning for score *has not done this type of thing in a long time, relies on visual cues, etc.  Pt's father present and reports pt was managing well prior to this admit.  Per pt and his father, pt is experiencing significant external stressors (work and social related) that are impacting him at this time.  His language and speech skills are intact however his decreased attention results in him needing repetition of  information.  Uncertain to level of cognitive deficits prior to admit given pt's h/o ETOH use however given he was functioning a high level prior to admit, he would benefit from follow up SLP to maximize his cognitive skills and establish functional compensations.  Pt agreeable to cognitive therapy on trials basis - advised him that attention can be improved and thus functionally improves memory.  No acute needs as pt functional in this environment.        SLP Assessment  SLP Recommendation/Assessment: All further Speech Lanaguage Pathology  needs can be addressed in the next venue of care SLP Visit Diagnosis: Attention and concentration deficit;Cognitive communication deficit (R41.841)    Follow Up Recommendations  Inpatient Rehab    Frequency and Duration           SLP Evaluation Cognition  Overall Cognitive Status: Impaired/Different from baseline(father present reports pt managing his career well prior to this admit) Arousal/Alertness: Awake/alert Orientation Level: Oriented to person;Oriented to place;Disoriented to time(year, date, day of week incorrect) Attention: Sustained Focused Attention: Appears intact Sustained Attention: Impaired Sustained Attention Impairment: Verbal basic;Verbal complex Memory: Impaired Memory Impairment: Storage deficit;Retrieval deficit(did not recall 4/4 words, 2/4 with multiple choice cue) Awareness: Impaired Awareness Impairment: Intellectual impairment Problem Solving: Impaired Problem Solving Impairment: Functional complex Safety/Judgment: Impaired       Comprehension  Auditory Comprehension Yes/No Questions: Not tested Commands: Within Functional Limits(for basic directions) Other Conversation Comments: pt is excessively verbose regarding his career Interfering Components: Attention  Visual Recognition/Discrimination Discrimination: Within Function Limits Reading Comprehension Reading Status: Not tested    Expression  Expression Primary Mode of Expression: Verbal Verbal Expression Overall Verbal Expression: Appears within functional limits for tasks assessed Initiation: No impairment Level of Generative/Spontaneous Verbalization: Conversation Repetition: No impairment Naming: No impairment Pragmatics: (pt excessively verbose) Non-Verbal Means of Communication: Not applicable Written Expression Dominant Hand: Right Written Expression: Not tested   Oral / Motor  Oral Motor/Sensory Function Overall Oral Motor/Sensory Function: Within functional limits Motor Speech Overall Motor Speech: Appears within functional limits for tasks assessed Respiration: Within functional limits Resonance: Within functional limits Articulation: Within functional limitis Intelligibility: Intelligible Motor Planning: Witnin functional limits Motor Speech Errors: Not applicable   GO                    Alfred Kelley, Alfred Kelley 06/28/2018, 12:06 PM  [name], MS [facility] SLP Acute Rehab Services Pager [phone] Office [phone]

## 2018-06-28 NOTE — Progress Notes (Signed)
Inpatient Rehabilitation Admissions Coordinator  I have reviewed OT and SLP evals. I will begin [facility] pending insurance approval. I will follow up tomorrow.  Ottie GlazierBarbara Chanoch Mccleery, RN, MSN Rehab Admissions Coordinator [phone] 06/28/2018 1:29 PM

## 2018-06-28 NOTE — Progress Notes (Signed)
Received order for soap suds enema earlier today. Discussed order with patient and patient refused enema as he had family visiting and didn't want to have an accident. Patient has had 3 bowel movements today with scheduled lactulose and denies need for a soap suds enema at this time.

## 2018-06-28 NOTE — Progress Notes (Addendum)
Family Medicine Teaching Service Daily Progress Note Intern Pager: [phone]  Patient name: Alfred Kelley Medical record number: [id number] Date of birth: [birth year] Age: 62 y.o. Gender: male  Primary Care Provider: Patient, No Pcp Per Consultants: GI PT Code Status: Full code  Pt Overview and Major Events to Date:  Admitted: 06/17/2018  Hospital Day: 12  11/10 Abdominal paracentesis s/p 1L transudative fluid   Assessment and Plan: Alfred Kelley is a 62 y.o. male who presented with jaundice, abdominal pain, confusion concerning for acute liver failure. PMHx is significant for essential hypertension, hyperlipidemia and alcohol use disorder.  #Alcohol Withdrawal, improving, resolving Patient is medically stable and CIWA can be discontinued. He has not required ativan in 72 hours.   Continue thiamine, multivitamins, folate  Appreciate case management assistance and recommendations for disposition.  Acute alcoholic liver failure, stable, improving Labs are stable. INR 1.96: Alb: 1.6  Daily physical exam, asterixis, mental status  Limit acetaminophen  Appreciate GI recommendations, signed off yesterday  Continue rifaximin 550 mg twice daily (indefinitely)  Follow up with Dr. Almyra Free 3-4 weeks after CIR.   Continue lactulose 10 g daily - titrate to 2-3 BM daily   Deconditioning & Unsteady gait  Patient will continue to work with PT. He is currently walking with a walker and reports feeling steady  PT/OT  OOB w/ assistance  CIR pending  Ascites s/p 1L paracentesis  Paracentesis labs are negative for lights criteria   Continue to follow   See below for abdominal distention  Abdominal distention 2/2 likely consideration  As it continued after paracentesis (which pt reports great relief from), likely due to excess fluid extravascular and stool (seen on imaging). S/p Lasix BID (11/11-). On laculose, 5 BM yesterday. In addition to slowly increased ascites build up from  liver disease. Patient would like paracentesis, however understands decsion for more conservative management as he is not in pain or having SOB.  Continue to follow patient symptoms  PRN Senna and lactulose for stool burden.   Diurese fluid with 20mg  torsemide BID, 50mg  spironolactone BID.  If not relieved with diuresis and BMs, can consider paracentesis with worsening  Strict I/O  Volume overload Patient has crackles bilaterally on exam today.  He is currently taking 20 mg of Lasix and 50 mg Aldactone daily.  Patient does not report shortness of breath. Urine occurrence 5x, no output measured.   Increase Lasix 20 mg to twice daily.  Hypertension, chronic, stable Blood pressures have been stable without medication  Holding home lisinopril hydrochlorothiazide  Hyperlipidemia  Continue to hold atorvastatin  Most recent lipid panel within normal limits  We will continue to follow outpatient  Macrocytic anemia with thrombocytopenia likely secondary to alcohol related acute liver failure Hemoglobin today is 9.3.  Hemoglobin remained stable MCV is 108.3.  We will hold on CBC tomorrow.  Check CBC with differential and INR prior to expected discharge  Patient will need follow-up outpatient  Continue multivitamins, folate, thiamine  GERD  Tums as needed  Protein Calorie Malnutrition  Continue Ensure live twice daily between meals  Hospital problems resolved Hyponatremia: BMP prior to discharge with outpatient follow-up.  1500 mL fluid restriction daily. Hypokalemia: BMP prior to discharge with outpatient follow-up Hematuria: Will need outpatient follow-up for recommended cystoscopy in setting of history of frequent nephrolithiasis.  Microscopic hematuria likely exacerbated by bili from liver failure.  Electrolytes: Replete PRN Nutrition: Heart healthy GI ppx: Tums, senna PRN DVT ppx: Lovenox 40 mg every 24 hours  Future labs: No labs for tomorrow  morning Disposition: location to be decided.  Patient will have to be asymptomatic for 48 hours.  Subjective  Patient reports doing well overnight. Pt has had good appetite and is responding to lasix well. He had 5 BM overnight. He reports that his abdomen has continued to be distended and is still annoying him. This is his biggest complaint.  Patient is anxious to start therapy.   Objective:   Vital Signs Temp:  [98.2 F (36.8 C)-98.9 F (37.2 C)] 98.2 F (36.8 C) (11/12 0433) Pulse Rate:  [98-120] 120 (11/12 0433) Resp:  [16-18] 18 (11/12 0433) BP: (122-144)/(75-81) 144/81 (11/12 0433) SpO2:  [95 %-97 %] 97 % (11/12 0433) Filed Weights   06/19/18 0615 06/25/18 0404 06/26/18 0500  Weight: 78.6 kg 79.1 kg 79 kg    Intake/Output 11/11 0701 - 11/12 0700 In: 640 [P.O.:640] Out: -   Physical Exam:  Gen: NAD, alert, non-toxic, well-appearing, sitting comfortably  Skin: Warm and dry. No obvious rashes, lesions, or trauma. HEENT: NCAT No conjunctival pallor or injection. No scleral icterus or injection.  MMM.  CV: RRR.  Normal S1-S2.   <2s capillary refill bilaterally.  RP & DPs 2+ bilaterally. No BLEE. Resp: Mild crackles at bases. No increased WOB Abd: + Bowel sounds. Abdomen appears distended, but not firm. Positive shifting dullness. NTTP. No bulging flanks.  Psych: Speech fluency and mentation improving. Patient repeats himself about getting ready to start rehab. He would like to get started and "get over" this.Improved psychomotor slowing.  Extremities: Moves all extremities spontaneously  Neuro: CN II-XII grossly intact. No FNDs.  No tremors.   Laboratory: Recent Labs  Lab 06/22/18 0329 06/23/18 0435 06/27/18 0329  WBC 9.2 8.0 6.7  HGB 9.5* 9.5* 9.3*  HCT 27.1* 27.9* 27.4*  PLT 121* 118* 129*   Recent Labs  Lab 06/22/18 0329 06/23/18 0435 06/25/18 0503 06/27/18 0329  NA 135 135 138 137  K 3.6 3.5 3.5 3.5  CL 102 102 105 103  CO2 31 24 29 29   BUN 8 8 6 8    CREATININE 0.73 0.67 0.83 0.75  CALCIUM 8.2* 8.1* 8.1* 7.7*  PROT 6.8 6.7  --  6.8  BILITOT 6.4* 6.0*  --  4.7*  ALKPHOS 61 57  --  57  ALT 51* 48*  --  39  AST 94* 87*  --  74*  GLUCOSE 107* 104* 119* 133*   Results for WITT, PLITT (MRN [id number]) as of 06/28/2018 07:28  Ref. Range 06/26/2018 12:01  Monocyte-Macrophage-Serous Fluid Latest Ref Range: 50 - 90 % 84  Albumin, Fluid Latest Units: g/dL <8.2  Fluid Type-FALB Unknown Peritoneal  Glucose, Fluid Latest Units: mg/dL 956  Fluid Type-FGLU Unknown Peritoneal  Fluid Type-FLDH Unknown Peritoneal  LD, Fluid Latest Ref Range: 3 - 23 U/L 47 (H)  Total protein, fluid Latest Units: g/dL <2.1  Triglycerides, Fluid Latest Ref Range: Not Estab. mg/dL 39  Fluid Type-FCT Unknown Peritoneal  Fluid Type-FTP Unknown Peritoneal  Fluid Type-FTRIG Unknown Peritoneal  Color, Fluid Latest Ref Range: YELLOW  YELLOW (A)  WBC, Fluid Latest Ref Range: 0 - 1,000 cu mm 163  Lymphs, Fluid Latest Units: % 15  Eos, Fluid Latest Units: % 0  Appearance, Fluid Latest Ref Range: CLEAR  HAZY (A)  Neutrophil Count, Fluid Latest Ref Range: 0 - 25 % 1    Imaging/Diagnostic Tests: US Paracentesis  Result Date: 06/26/2018 INDICATION: History of ETOH cirrhosis, acute liver failure, ETOH withdrawal.  New onset ascites - request for diagnostic and therapeutic paracentesis. EXAM: ULTRASOUND GUIDED DIAGNOSTIC AND THERAPEUTIC PARACENTESIS MEDICATIONS: 10 mL 1% lidocaine. COMPLICATIONS: None immediate. PROCEDURE: Informed written consent was obtained from the patient after a discussion of the risks, benefits and alternatives to treatment. A timeout was performed prior to the initiation of the procedure. Initial ultrasound scanning demonstrates a small amount of ascites within the left lower abdominal quadrant. The left lower abdomen was prepped and draped in the usual sterile fashion. 1% lidocaine was used for local anesthesia. Following this, a 6 Fr Safe-T-Centesis  catheter was introduced. An ultrasound image was saved for documentation purposes. The paracentesis was performed. The catheter was removed and a dressing was applied. The patient tolerated the procedure well without immediate post procedural complication. FINDINGS: A total of approximately 1.0 L of clear yellow fluid was removed. Samples were sent to the laboratory as requested by the clinical team. IMPRESSION: Successful ultrasound-guided paracentesis yielding 1.0 liters of peritoneal fluid. Read by Lynnette Caffey, PA-C Electronically Signed   By: Malachy Moan M.D.   On: 06/26/2018 12:04     Melene Plan, MD 06/28/2018, 7:28 AM PGY-1, [facility] Family Medicine FPTS Intern pager: [phone], text pages welcome

## 2018-06-28 NOTE — Evaluation (Signed)
Occupational Therapy Evaluation Patient Details Name: Alfred Kelley MRN: [id number] DOB: [birth year] Today's Date: 06/28/2018    History of Present Illness 62 y.o. male with past medical history of alcohol abuse brought into the hospital by brother for evaluation of jaundice and confusion yesterday.  Upon initial evaluation, he was found to have jaundice with total bilirubin of 12.8, AST 154, ALT 85, normal alkaline phosphatase.  INR 1.98.  CBC showed mild leukocytosis with WBC 13.0,  hemoglobin of 11.9 and platelet count of 118.  CT abdomen pelvis with IV contrast showed heterogenicity of the liver parenchyma and small volume ascites with differential of cirrhosis and possible concurrent acute hepatitis.   Clinical Impression   Pt admitted with the above diagnosis and has the deficits listed below. Pt would benefit from cont OT to increase independence with basic adls and adls transfers as well as high level executive skills so he can possibly return to living in [place] again at some point.  Pt has supportive family here in [place] and will live with them indefinitely until safe to be alone again.  Pt has daughters in [place] as well but they are unable to provide 24/7 S. 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  Feel CIR would be a good option given pt has excellent family support post rehab.     Follow Up Recommendations  CIR;Supervision/Assistance - 24 hour    Equipment Recommendations  Other (comment)(tbd)    Recommendations for Other Services       Precautions / Restrictions Precautions Precautions: Fall Restrictions Weight Bearing Restrictions: No      Mobility Bed Mobility Overal bed mobility: Needs Assistance Bed Mobility: Supine to Sit     Supine to sit: Min guard;HOB elevated Sit to supine: Min guard   General bed mobility comments: increased time and  effort; hands on to untangle from bed sheets; limited safety awareness increasing fall risk  Transfers Overall transfer level: Needs assistance Equipment used: Rolling walker (2 wheeled) Transfers: Sit to/from Stand Sit to Stand: Min guard Stand pivot transfers: Min assist       General transfer comment: attempted ambulation without walker and pt was very unsteady and impulsive.     Balance Overall balance assessment: Needs assistance Sitting-balance support: No upper extremity supported;Feet supported Sitting balance-Leahy Scale: Good     Standing balance support: Bilateral upper extremity supported;During functional activity Standing balance-Leahy Scale: Poor Standing balance comment: Pt requires walker to be safe attempting ambulation                           ADL either performed or assessed with clinical judgement   ADL Overall ADL's : Needs assistance/impaired Eating/Feeding: Set up;Sitting   Grooming: Wash/dry hands;Wash/dry face;Oral care;Min guard;Standing;Cueing for sequencing;Cueing for safety Grooming Details (indicate cue type and reason): Pt requires min guard to stand at sink due to poor balance and impulsivity.  Pt not thorough with tasks requiring minimal VCs to finish task completely. Upper Body Bathing: Set up;Sitting   Lower Body Bathing: Minimal assistance;Sit to/from stand;Cueing for compensatory techniques;Cueing for safety Lower Body Bathing Details (indicate cue type and reason): Pt very unsteady when on his feet and impulsive requiring VCs to slow down but pt does not slow down when cued to do so without repeated cues. Upper Body Dressing : Sitting;Supervision/safety;Cueing for safety   Lower Body Dressing:  Minimal assistance;Sit to/from stand;Cueing for compensatory techniques;Cueing for safety Lower Body Dressing Details (indicate cue type and reason): When in standing and not holding to walker, pt has balance deficits when fastening  pants/pulling them up.   Toilet Transfer: Minimal assistance;Ambulation;Comfort height toilet;RW;Cueing for safety Toilet Transfer Details (indicate cue type and reason): Pt walked to bathroom and hit L side of body on doorframe walking in.  Pt does not demonstrate vision deficits; feel it is attn related.   Toileting- Clothing Manipulation and Hygiene: Minimal assistance;Sit to/from stand;Cueing for safety;Cueing for compensatory techniques Toileting - Clothing Manipulation Details (indicate cue type and reason): In standing, pt requires min assist to stay safe.  Very little insight to his deficits in areas of balance and cognition.     Functional mobility during ADLs: Minimal assistance;Rolling walker;Cueing for safety General ADL Comments: Pt requires overall minimal physical assist to complete basic adls due to poor standing balance and decreased cognition.  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  Seems to have a very stressful job that requires high level executive functioning that currently is limited.  Pt has very little insight to these deficits.  Very supportive family.     Vision Baseline Vision/History: Wears glasses Wears Glasses: At all times Patient Visual Report: No change from baseline Vision Assessment?: No apparent visual deficits Additional Comments: No vision deficits noted although pt did run into L door frame x2.     Perception Perception Perception Tested?: No   Praxis Praxis Praxis tested?: Within functional limits    Pertinent Vitals/Pain Pain Assessment: No/denies pain Faces Pain Scale: Hurts little more Pain Location: generalized Pain Descriptors / Indicators: Grimacing Pain Intervention(s): Repositioned;Monitored during session     Hand Dominance Right   Extremity/Trunk Assessment Upper Extremity Assessment Upper Extremity Assessment: Overall WFL for tasks assessed   Lower Extremity Assessment Lower Extremity  Assessment: Defer to PT evaluation   Cervical / Trunk Assessment Cervical / Trunk Assessment: Normal   Communication Communication Communication: No difficulties   Cognition Arousal/Alertness: Awake/alert Behavior During Therapy: Restless;Impulsive Overall Cognitive Status: Impaired/Different from baseline Area of Impairment: Orientation;Attention;Memory;Following commands;Safety/judgement;Awareness;Problem solving                 Orientation Level: Time Current Attention Level: Sustained Memory: Decreased recall of precautions;Decreased short-term memory Following Commands: Follows multi-step commands inconsistently Safety/Judgement: Decreased awareness of safety;Decreased awareness of deficits Awareness: Intellectual Problem Solving: Slow processing;Difficulty sequencing;Requires verbal cues General Comments: pt likes to "kid around" and seems to use this as a way of covering memory/attn issues.   General Comments  Pt limited with balance, functional movement during adls and cognition.  Feel pt will need 24 hour supervision at d/c and will need extensive therapy if he is ever to return to work he currently does.    Exercises     Shoulder Instructions      Home Living Family/patient expects to be discharged to:: Inpatient rehab Living Arrangements: Alone Available Help at Discharge: Family;Available 24 hours/day Type of Home: House                       Home Equipment: None          Prior Functioning/Environment Level of Independence: Independent        Comments: Pt lives alone at baseline in [place] and is an Theatre stage manager.  Plan is to live in [place] with brother post d/c.        OT  Problem List: Impaired balance (sitting and/or standing);Decreased cognition;Decreased safety awareness;Decreased knowledge of use of DME or AE;Decreased knowledge of precautions;Pain      OT Treatment/Interventions: Self-care/ADL training;DME and/or AE  instruction;Therapeutic activities;Cognitive remediation/compensation;Balance training    OT Goals(Current goals can be found in the care plan section) Acute Rehab OT Goals Patient Stated Goal: not stated OT Goal Formulation: With patient Time For Goal Achievement: 07/12/18 Potential to Achieve Goals: Good ADL Goals Pt Will Perform Grooming: with modified independence;standing Pt Will Perform Lower Body Bathing: with modified independence;sit to/from stand Pt Will Perform Lower Body Dressing: with modified independence;sit to/from stand Additional ADL Goal #1: Pt will walk to bathroom with walker and toilet with mod I. Additional ADL Goal #2: Pt will complete simple check writing task and check balancing task Ily Additional ADL Goal #3: Pt will follow 3 step commands Ily.  OT Frequency: Min 3X/week   Barriers to D/C:    pt to live with family here in [place] that can provide 24 hour Supervision.       Co-evaluation              AM-PAC PT "6 Clicks" Daily Activity     Outcome Measure Help from another person eating meals?: None Help from another person taking care of personal grooming?: A Little Help from another person toileting, which includes using toliet, bedpan, or urinal?: A Little Help from another person bathing (including washing, rinsing, drying)?: A Little Help from another person to put on and taking off regular upper body clothing?: None Help from another person to put on and taking off regular lower body clothing?: A Little 6 Click Score: 20   End of Session Equipment Utilized During Treatment: Rolling walker Nurse Communication: Mobility status  Activity Tolerance: Patient tolerated treatment well Patient left: in bed;with call bell/phone within reach;Other (comment)(Speech therapist present)  OT Visit Diagnosis: Unsteadiness on feet (R26.81);Other symptoms and signs involving cognitive function;Other symptoms and signs involving the nervous system  (R29.898)                Time: 1001-1045 OT Time Calculation (min): 44 min Charges:  OT General Charges $OT Visit: 1 Visit OT Evaluation $OT Eval Moderate Complexity: 1 Mod OT Treatments $Self Care/Home Management : 23-37 mins  Tory EmeraldHolly Sarissa Dern, OTR/L [phone]  Hope BuddsJones, Davione Lenker Anne 06/28/2018, 11:07 AM

## 2018-06-29 MED ORDER — MELATONIN 3 MG PO TABS
4.5000 mg | ORAL_TABLET | Freq: Every day | ORAL | Status: DC
  Administered 2018-06-29: 4.5 mg via ORAL
  Filled 2018-06-29 (×2): qty 1.5

## 2018-06-29 MED ORDER — SENNA 8.6 MG PO TABS
1.0000 | ORAL_TABLET | Freq: Every day | ORAL | Status: DC
  Administered 2018-06-29 – 2018-06-30 (×2): 8.6 mg via ORAL
  Filled 2018-06-29 (×2): qty 1

## 2018-06-29 NOTE — Progress Notes (Addendum)
Tx performed and charted by SPTA under direct guidance and supervision of PTA at all times. This session was performed under the supervision of a licensed clinician.   Charting reviewed for accuracy and depicts tx performed and services provided.  [name], PTA Acute Rehabilitation Services Pager [phone] Office [phone]    Physical Therapy Treatment Patient Details Name: Alfred Kelley MRN: [id number] DOB: 1956/04/12 Today's Date: 06/29/2018    History of Present Illness 62 y.o. male with past medical history of alcohol abuse brought into the hospital by brother for evaluation of jaundice and confusion yesterday.  Upon initial evaluation, he was found to have jaundice with total bilirubin of 12.8, AST 154, ALT 85, normal alkaline phosphatase.  INR 1.98.  CBC showed mild leukocytosis with WBC 13.0,  hemoglobin of 11.9 and platelet count of 118.  CT abdomen pelvis with IV contrast showed heterogenicity of the liver parenchyma and small volume ascites with differential of cirrhosis and possible concurrent acute hepatitis.    PT Comments    Pt was in bed upon arrival. Pt was pleasant willing to participate in therapy. He remains impulsive and requires VC for safety with AD. Pt participated in gait training, balance activities and LE strengthening this session. Pt seemed upset that he may be discharged tomorrow. Pt is making progress toward stated goals and would benefit from continued skilled PT in order to maximize functional independence. Pt was denied CIR rehab and will inform supervising PT of need for SNF post acutely to address listed deficits (see below).    Follow Up Recommendations  Supervision/Assistance - 24 hour;SNF     Equipment Recommendations  None recommended by PT    Recommendations for Other Services      Precautions / Restrictions Precautions Precautions: Fall Restrictions Weight Bearing Restrictions: No    Mobility  Bed Mobility Overal bed  mobility: Needs Assistance Bed Mobility: Supine to Sit     Supine to sit: Min guard;HOB elevated Sit to supine: Min guard   General bed mobility comments: Min guard for safety  Transfers Overall transfer level: Needs assistance Equipment used: Rolling walker (2 wheeled) Transfers: Sit to/from Stand Sit to Stand: Min guard         General transfer comment: cues for hand placement for safety. Pt very impulsive at times, bouncing on EOB and joking throughout session.   Ambulation/Gait Ambulation/Gait assistance: Min guard;Min assist Gait Distance (Feet): 100 Feet Assistive device: Rolling walker (2 wheeled) Gait Pattern/deviations: Step-through pattern;Decreased stride length;Drifts right/left;Trunk flexed;Narrow base of support     General Gait Details: unsteady gait pattern. Requires consistent verbal cueing for safety and obstacle navigation. easily distractible; Min A with turning and navigating.   Stairs             Wheelchair Mobility    Modified Rankin (Stroke Patients Only)       Balance Overall balance assessment: Needs assistance Sitting-balance support: No upper extremity supported;Feet supported Sitting balance-Leahy Scale: Good     Standing balance support: Bilateral upper extremity supported;During functional activity Standing balance-Leahy Scale: Poor Standing balance comment: dependent on UE support and external assist.               High Level Balance Comments: At counter top for safety, Heel/toe raise x 10 reps, pt required bil UE support and Min A for safety; Narrow BOS with head turns EO then EC with hands hovering counter, pt required Min A to prevent LOB.  Cognition Arousal/Alertness: Awake/alert Behavior During Therapy: Impulsive Overall Cognitive Status: Impaired/Different from baseline Area of Impairment: Orientation;Attention;Safety/judgement;Problem solving                 Orientation Level:  Time Current Attention Level: Sustained Memory: Decreased recall of precautions;Decreased short-term memory Following Commands: Follows multi-step commands inconsistently Safety/Judgement: Decreased awareness of safety;Decreased awareness of deficits Awareness: Intellectual Problem Solving: Requires verbal cues;Slow processing General Comments: Pt very impulsive throughout session.      Exercises Total Joint Exercises Hip ABduction/ADduction: AROM;Right;5 reps;Left;10 reps;Standing(pt c/o of L knee pain) Long Arc Quad: 10 reps;Seated;Right;Left;AROM Marching in Standing: AROM;5 reps;Right;Left;Other (comment)(pt c/o on L knee pain) Standing Hip Extension: AROM;10 reps;Right;Left;Standing    General Comments General comments (skin integrity, edema, etc.): No family present this session.      Pertinent Vitals/Pain Faces Pain Scale: Hurts little more Pain Location: L Knee  Pain Descriptors / Indicators: Grimacing Pain Intervention(s): Limited activity within patient's tolerance;Monitored during session;Heat applied    Home Living                      Prior Function            PT Goals (current goals can now be found in the care plan section) Acute Rehab PT Goals Patient Stated Goal: not stated PT Goal Formulation: With patient/family Time For Goal Achievement: 07/02/18 Potential to Achieve Goals: Good Progress towards PT goals: Progressing toward goals    Frequency    Min 3X/week      PT Plan Discharge plan needs to be updated    Co-evaluation              AM-PAC PT "6 Clicks" Daily Activity  Outcome Measure  Difficulty turning over in bed (including adjusting bedclothes, sheets and blankets)?: A Little Difficulty moving from lying on back to sitting on the side of the bed? : A Little Difficulty sitting down on and standing up from a chair with arms (e.g., wheelchair, bedside commode, etc,.)?: Unable Help needed moving to and from a bed to chair  (including a wheelchair)?: A Little Help needed walking in hospital room?: A Little Help needed climbing 3-5 steps with a railing? : A Little 6 Click Score: 16    End of Session Equipment Utilized During Treatment: Gait belt Activity Tolerance: Patient tolerated treatment well Patient left: in bed;with call bell/phone within reach;with bed alarm set Nurse Communication: Mobility status PT Visit Diagnosis: Unsteadiness on feet (R26.81)     Time: 1550-1616 PT Time Calculation (min) (ACUTE ONLY): 26 min  Charges:  $Gait Training: 8-22 mins $Therapeutic Exercise: 8-22 mins                    [place], SPTA    [place] 06/29/2018, 5:19 PM

## 2018-06-29 NOTE — Progress Notes (Signed)
Family Medicine Teaching Service Daily Progress Note Intern Pager: [phone]  Patient name: Alfred Kelley Medical record number: [id number] Date of birth: Aug 05, 1956 Age: 62 y.o. Gender: male  Primary Care Provider: Patient, No Pcp Per Consultants: GI PT Code Status: Full code  Pt Overview and Major Events to Date:  Admitted: 06/17/2018  Hospital Day: 13  11/10 Abdominal paracentesis s/p 1L transudative fluid   Assessment and Plan: Alfred Kelley is a 62 y.o. male who presented with jaundice, abdominal pain, confusion concerning for acute liver failure. PMHx is significant for essential hypertension, hyperlipidemia and alcohol use disorder.  Acute alcoholic liver failure, stable, improving Patient continues to improve.   Daily physical exam, asterixis, mental status  Limit acetaminophen  Appreciate GI recommendations, signed off 11/11  Continue rifaximin 550 mg twice daily (indefinitely)  Follow up with Dr. Almyra Free 3-4 weeks after CIR.   Continue lactulose 10 g daily - titrate to 2-3 BM daily   Deconditioning & Unsteady gait  Patient will continue to work with PT. He is currently walking with a walker and reports feeling steady  PT/OT  OOB w/ assistance  CIR denied. CSW help with SNF placement.   Ascites s/p 1L paracentesis  Paracentesis labs are negative for lights criteria   Continue to follow   See below for abdominal distention  Abdominal distention 2/2 likely consideration  Continues to have abdominal distention. Continues to have BMs. Continue to diurese.  Continue to follow patient symptoms  Scheduled Senna once daily and lactulose for stool burden.   Diurese fluid with 20mg  torsemide BID, 100mg  spironolactone BID.  If not relieved with diuresis and BMs, can consider paracentesis with worsening  Strict I/O  Insomnia 4.5mg  melatonin WHS 2000    Hypertension, chronic, stable Blood pressures have been stable without medication  Holding home  lisinopril hydrochlorothiazide  Hyperlipidemia  Continue to hold atorvastatin  Most recent lipid panel within normal limits  We will continue to follow outpatient  Macrocytic anemia with thrombocytopenia likely secondary to alcohol related acute liver failure Hemoglobin today is 9.3.  Hemoglobin remained stable MCV is 108.3.  We will hold on CBC tomorrow.  Check CBC with differential and INR prior to expected discharge  Patient will need follow-up outpatient  Continue multivitamins, folate, thiamine  GERD  Tums as needed  Protein Calorie Malnutrition  Continue Ensure live twice daily between meals  Hospital problems resolved Hyponatremia: BMP prior to discharge with outpatient follow-up.  1500 mL fluid restriction daily. Hypokalemia: BMP prior to discharge with outpatient follow-up Hematuria: Will need outpatient follow-up for recommended cystoscopy in setting of history of frequent nephrolithiasis.  Microscopic hematuria likely exacerbated by bili from liver failure.  Electrolytes: Replete PRN Nutrition: Heart healthy GI ppx: Tums, senna PRN DVT ppx: Lovenox 40 mg every 24 hours  Future labs: No labs for tomorrow morning Disposition: location to be decided.  Patient will have to be asymptomatic for 48 hours.  Subjective  Patient reports doing well overnight. He reports that he is "eating like a pig". Good appetite, normal voiding. Mom is at bedside today. Patient expresses a lot of frustration about his work today.   Objective:   Vital Signs Temp:  [98.2 F (36.8 C)-99.9 F (37.7 C)] 98.2 F (36.8 C) (11/13 1444) Pulse Rate:  [104-105] 104 (11/13 1444) Resp:  [17-18] 18 (11/13 1444) BP: (119-131)/(71-87) 131/87 (11/13 1444) SpO2:  [95 %-98 %] 98 % (11/13 1444) Filed Weights   06/19/18 0615 06/25/18 0404 06/26/18 0500  Weight: 78.6  kg 79.1 kg 79 kg    Intake/Output 11/12 0701 - 11/13 0700 In: 240 [P.O.:240] Out: 400 [Urine:400]  Physical Exam:   Gen: NAD, alert, non-toxic, well-appearing, sitting comfortably  Skin: Warm and dry HEENT: NCAT.  MMM.  CV: RRR.  Normal S1-S2. No BLEE. Resp: CTAB. No increased WOB Abd: post BS. Abdomen distended but not firm. Positive shifting dullness. No bulging flanks. NTTP.  Extremities: moves extremities spontaneously. Warm and well perfused.  Psych: Speech fluency and mentation continue to improve.  Laboratory: Recent Labs  Lab 06/23/18 0435 06/27/18 0329  WBC 8.0 6.7  HGB 9.5* 9.3*  HCT 27.9* 27.4*  PLT 118* 129*   Recent Labs  Lab 06/23/18 0435 06/25/18 0503 06/27/18 0329  NA 135 138 137  K 3.5 3.5 3.5  CL 102 105 103  CO2 24 29 29   BUN 8 6 8   CREATININE 0.67 0.83 0.75  CALCIUM 8.1* 8.1* 7.7*  PROT 6.7  --  6.8  BILITOT 6.0*  --  4.7*  ALKPHOS 57  --  57  ALT 48*  --  39  AST 87*  --  74*  GLUCOSE 104* 119* 133*    Imaging/Diagnostic Tests: No results found.   Melene PlanKim,  E, MD 06/29/2018, 3:08 PM PGY-1, [facility] FPTS Intern pager: [phone], text pages welcome

## 2018-06-29 NOTE — Progress Notes (Addendum)
Inpatient Rehabilitation Admissions Coordinator  I have received a denial for admission to inpt rehab. Insurance MD feels his care can be handled at a lower level of care/SNF or Home Health. I discussed with Dr. Delice Lesch and it is felt that there are no grounds to appeal this decision. I met with patient and his Mom at bedside to make them aware of the decision. We discussed the need to clarify if family/brother would want d/c home with HH/outpt therapy and 24/7 care of family or SNF. I have left a voicemail for pt's brother, Wille Glaser, to contact me. RN CM made aware of these discussions.   Patient requesting FMLA and short term disability papers be completed prior to his d/c. I will discuss with his brother the need to provide paperwork.  Pt' s Mom assisting pt to bathroom with his RW as I was leaving room. I recommended they call for assistance to bathroom which they did with my encouragement.  Danne Baxter, RN, MSN Rehab Admissions Coordinator [phone] 06/29/2018 1:06 PM  I received return call from pt's brother, Wille Glaser. He was made aware of denial and is very angry with the decision. Wille Glaser feels patient needs close observation of nursing and medical staff and feels patient not ready for d/c home with home health and family support. He wishes to pursue SNF rehab. I notified RN CM and SW of plan. I contacted Dr. Maudie Mercury to make aware of denial and brother's frustrations.  We will sign off at this time.  Danne Baxter, RN, MSN Rehab Admissions Coordinator [phone] 06/29/2018 2:07 PM

## 2018-06-29 NOTE — Progress Notes (Signed)
Occupational Therapy Treatment Patient Details Name: Alfred Kelley MRN: [id number] DOB: [birth year] Today's Date: 06/29/2018    History of present illness 62 y.o. male with past medical history of alcohol abuse brought into the hospital by brother for evaluation of jaundice and confusion yesterday.  Upon initial evaluation, he was found to have jaundice with total bilirubin of 12.8, AST 154, ALT 85, normal alkaline phosphatase.  INR 1.98.  CBC showed mild leukocytosis with WBC 13.0,  hemoglobin of 11.9 and platelet count of 118.  CT abdomen pelvis with IV contrast showed heterogenicity of the liver parenchyma and small volume ascites with differential of cirrhosis and possible concurrent acute hepatitis.   OT comments  Pt up with OT to work on self care tasks standing at the sink. Pt needing cues for safety and thoroughness with tasks and note he has decreased attention to tasks at times. Pt will benefit from continued OT to progress ADL safety and independence for next venue.    Follow Up Recommendations  CIR;Supervision/Assistance - 24 hour    Equipment Recommendations  Other (comment)(TBD)    Recommendations for Other Services      Precautions / Restrictions Precautions Precautions: Fall Restrictions Weight Bearing Restrictions: No       Mobility Bed Mobility               General bed mobility comments: in chair.   Transfers Overall transfer level: Needs assistance Equipment used: Rolling walker (2 wheeled) Transfers: Sit to/from Stand Sit to Stand: Min guard         General transfer comment: cues for hand placement for safety. Pt attempted to get up from chair before lowering footrest. very impulsive at times.     Balance Overall balance assessment: Needs assistance Sitting-balance support: No upper extremity supported;Feet supported Sitting balance-Leahy Scale: Good     Standing balance support: Bilateral upper extremity supported;During functional  activity Standing balance-Leahy Scale: Poor Standing balance comment: pt requiring walker for safety.                           ADL either performed or assessed with clinical judgement   ADL       Grooming: Wash/dry face;Oral care;Min guard;Cueing for safety;Cueing for sequencing Grooming Details (indicate cue type and reason): Pt not squeezing out washcloth once he wets it so water getting on counter. Gave cues to make sure he gets excess water out to avoid water on counter and floor. Pt also having to ask if toothpaste cap already off as pt trying to unscrew top but it was already off. Pt also forgetting that he had already washed face and asked if he needed to wash face only a few minutes after completing this already. CUes for safety and sequencing.                                General ADL Comments: Pt tends to move quickly and needed cues with walker to go slower and anticipate obstacles. He tends to hurry through tasks and doesnt carry out thoroughly. Needed cues to squeeze excess water out of washcloth with bathing to avoid mess/wet floor. Pt reporting he took a shower on his own last night. MOther in room but states she wasnt present when pt did shower. Cautioned pt to not get up on his own and not try to perform shower or self care tasks on his  own for his safety right now.  Encouraged him to call for assistance. Pt thought he had just had dinner meal when it was his breakfast-reoriented pt to time of day.      Vision Baseline Vision/History: Wears glasses Wears Glasses: At all times Patient Visual Report: No change from baseline     Perception     Praxis      Cognition Arousal/Alertness: Awake/alert Behavior During Therapy: Impulsive Overall Cognitive Status: Impaired/Different from baseline Area of Impairment: Orientation;Attention;Safety/judgement;Problem solving                 Orientation Level: Time Current Attention Level:  Sustained Memory: Decreased recall of precautions;Decreased short-term memory Following Commands: Follows multi-step commands inconsistently Safety/Judgement: Decreased awareness of safety;Decreased awareness of deficits Awareness: Intellectual Problem Solving: Requires verbal cues;Slow processing General Comments: Pt needing consistent cues for completion of tasks in a thorough and orderly manner. Pt forgetting that he had already completed parts of tasks and note difficult to keep attention on tasks.         Exercises     Shoulder Instructions       General Comments      Pertinent Vitals/ Pain       Pain Assessment: No/denies pain  Home Living                                          Prior Functioning/Environment              Frequency  Min 2X/week        Progress Toward Goals  OT Goals(current goals can now be found in the care plan section)  Progress towards OT goals: Progressing toward goals     Plan Discharge plan remains appropriate    Co-evaluation                 AM-PAC PT "6 Clicks" Daily Activity     Outcome Measure   Help from another person eating meals?: None Help from another person taking care of personal grooming?: A Little Help from another person toileting, which includes using toliet, bedpan, or urinal?: A Little Help from another person bathing (including washing, rinsing, drying)?: A Little Help from another person to put on and taking off regular upper body clothing?: None Help from another person to put on and taking off regular lower body clothing?: A Little 6 Click Score: 20    End of Session Equipment Utilized During Treatment: Rolling walker;Gait belt  OT Visit Diagnosis: Unsteadiness on feet (R26.81);Other symptoms and signs involving cognitive function   Activity Tolerance Patient tolerated treatment well   Patient Left in chair;with call bell/phone within reach;with family/visitor present;with  chair alarm set   Nurse Communication Mobility status        Time: [id number] OT Time Calculation (min): 31 min  Charges: OT General Charges $OT Visit: 1 Visit OT Treatments $Self Care/Home Management : 8-22 mins $Therapeutic Activity: 8-22 mins     Judithann SaugerStephanie Willis Kuipers OTR/L Acute Rehabilitation [phone] office number    06/29/2018, 11:06 AM

## 2018-06-30 LAB — COMPREHENSIVE METABOLIC PANEL
ALBUMIN: 2.1 g/dL — AB (ref 3.5–5.0)
ALK PHOS: 61 U/L (ref 38–126)
ALT: 41 U/L (ref 0–44)
ANION GAP: 8 (ref 5–15)
AST: 79 U/L — AB (ref 15–41)
BUN: 8 mg/dL (ref 6–20)
CHLORIDE: 96 mmol/L — AB (ref 98–111)
CO2: 30 mmol/L (ref 22–32)
Calcium: 8.1 mg/dL — ABNORMAL LOW (ref 8.9–10.3)
Creatinine, Ser: 0.82 mg/dL (ref 0.61–1.24)
GFR calc Af Amer: >60 mL/min (ref >60)
GFR calc non Af Amer: >60 mL/min (ref >60)
GLUCOSE: 115 mg/dL — AB (ref 70–99)
POTASSIUM: 3.5 mmol/L (ref 3.5–5.1)
Sodium: 134 mmol/L — ABNORMAL LOW (ref 135–145)
Total Bilirubin: 6.2 mg/dL — ABNORMAL HIGH (ref 0.3–1.2)
Total Protein: 8.2 g/dL — ABNORMAL HIGH (ref 6.5–8.1)

## 2018-06-30 LAB — CBC
HCT: 31.9 % — ABNORMAL LOW (ref 39.0–52.0)
HEMOGLOBIN: 10.9 g/dL — AB (ref 13.0–17.0)
MCH: 36.5 pg — AB (ref 26.0–34.0)
MCHC: 34.2 g/dL (ref 30.0–36.0)
MCV: 106.7 fL — AB (ref 80.0–100.0)
Platelets: 158 10*3/uL (ref 150–400)
RBC: 2.99 MIL/uL — AB (ref 4.22–5.81)
RDW: 17 % — ABNORMAL HIGH (ref 11.5–15.5)
WBC: 9.9 10*3/uL (ref 4.0–10.5)
nRBC: 0 % (ref 0.0–0.2)

## 2018-06-30 LAB — PROTIME-INR
INR: 1.85
Prothrombin Time: 21.1 seconds — ABNORMAL HIGH (ref 11.4–15.2)

## 2018-06-30 MED ORDER — SPIRONOLACTONE 100 MG PO TABS: 100.0000 mg | ORAL_TABLET | Freq: Two times a day (BID) | ORAL | 3 refills | Status: DC

## 2018-06-30 MED ORDER — RIFAXIMIN 550 MG PO TABS: 550.0000 mg | ORAL_TABLET | Freq: Two times a day (BID) | ORAL | 3 refills | Status: DC

## 2018-06-30 MED ORDER — TORSEMIDE 20 MG PO TABS: 20.0000 mg | ORAL_TABLET | Freq: Two times a day (BID) | ORAL | 3 refills | Status: DC

## 2018-06-30 MED ORDER — THIAMINE HCL 100 MG PO TABS: 100.0000 mg | ORAL_TABLET | Freq: Every day | ORAL | 3 refills | Status: AC

## 2018-06-30 MED ORDER — FOLIC ACID 1 MG PO TABS: 1.0000 mg | ORAL_TABLET | Freq: Every day | ORAL | 3 refills | Status: DC

## 2018-06-30 MED ORDER — RIFAXIMIN 550 MG PO TABS: 550.0000 mg | ORAL_TABLET | Freq: Two times a day (BID) | ORAL | 3 refills | Status: AC

## 2018-06-30 MED ORDER — FOLIC ACID 1 MG PO TABS: 1.0000 mg | ORAL_TABLET | Freq: Every day | ORAL | 3 refills | Status: AC

## 2018-06-30 MED ORDER — ADULT MULTIVITAMIN W/MINERALS CH: 1.0000 | ORAL_TABLET | Freq: Every day | ORAL | 3 refills | Status: AC

## 2018-06-30 MED ORDER — LACTULOSE 10 GM/15ML PO SOLN: 20.0000 g | Freq: Two times a day (BID) | ORAL | 0 refills | Status: DC

## 2018-06-30 MED ORDER — ENSURE ENLIVE PO LIQD: 237.0000 mL | Freq: Two times a day (BID) | ORAL | 12 refills | Status: AC

## 2018-06-30 MED ORDER — THIAMINE HCL 100 MG PO TABS: 100.0000 mg | ORAL_TABLET | Freq: Every day | ORAL | 3 refills | Status: DC

## 2018-06-30 MED ORDER — LACTULOSE 10 GM/15ML PO SOLN: 20.0000 g | Freq: Two times a day (BID) | ORAL | 0 refills | Status: AC

## 2018-06-30 MED ORDER — SPIRONOLACTONE 100 MG PO TABS: 100.0000 mg | ORAL_TABLET | Freq: Two times a day (BID) | ORAL | 3 refills | Status: AC

## 2018-06-30 MED ORDER — ADULT MULTIVITAMIN W/MINERALS CH: 1.0000 | ORAL_TABLET | Freq: Every day | ORAL | 3 refills | Status: DC

## 2018-06-30 MED ORDER — TORSEMIDE 20 MG PO TABS: 20.0000 mg | ORAL_TABLET | Freq: Two times a day (BID) | ORAL | 3 refills | Status: AC

## 2018-06-30 MED ORDER — ENSURE ENLIVE PO LIQD: 237.0000 mL | Freq: Two times a day (BID) | ORAL | 12 refills | Status: DC

## 2018-06-30 NOTE — Discharge Instructions (Signed)
Nutrition Post Hospital Stay Proper nutrition can help your body recover from illness and injury.   Foods and beverages high in protein, vitamins, and minerals help rebuild muscle loss, promote healing, & reduce fall risk.   In addition to eating healthy foods, a nutrition shake is an easy, delicious way to get the nutrition you need during and after your hospital stay  It is recommended that you continue to drink 2 bottles per day of:  Ensure Enlive for at least 1 month (30 days) after your hospital stay  Tips for adding a nutrition shake into your routine: As allowed, drink one with vitamins or medications instead of water or juice Enjoy one as a tasty mid-morning or afternoon snack Drink cold or make a milkshake out of it Drink one instead of milk with cereal or snacks Use as a coffee creamer   Available at the following grocery stores and pharmacies:           * [employer] * [employer] * [employer]  * R[employer]          * [employer] * [employer]  * Wal[employer]reens      * Target  * [employer]'s   * [employer]S  * [employer]   * [facility] [phone]            For COUPONS visit: www.ensure.com/join or [email or web address]/sign-[email or web address]   Suggested Substitutions Ensure Plus = Boost Plus = Carnation Breakfast Essentials = Boost Compact Ensure Active Clear = Boost Breeze Glucerna Shake = Boost Glucose Control = Carnation Breakfast Essentials SUGAR FREE   Please also continue to take general multivitamin and B-complex vitamins.

## 2018-06-30 NOTE — Progress Notes (Signed)
Family Medicine Teaching Service Daily Progress Note Intern Pager: [phone]  Patient name: Alfred Kelley Medical record number: [id number] Date of birth: Oct 08, 1955 Age: 62 y.o. Gender: male  Primary Care Provider: Patient, No Pcp Per Consultants: GI PT Code Status: Full code  Pt Overview and Major Events to Date:  Admitted: 06/17/2018  Hospital Day: 14  11/10 Abdominal paracentesis s/p 1L transudative fluid   Assessment and Plan: Alfred Kelley is a 62 y.o. male who presented with jaundice, abdominal pain, confusion concerning for acute liver failure. PMHx is significant for essential hypertension, hyperlipidemia and alcohol use disorder.  Acute alcoholic liver failure, stable, improving Patient continues to improve daily.  F/u INR this AM.  Appreciate GI recommendations, signed off 11/11  Continue rifaximin 550 mg twice daily (indefinitely)  Follow up with Dr. Almyra Free 3-4 weeks after CIR.   Continue lactulose 10 g daily - titrate to 2-3 BM daily   Deconditioning & Unsteady gait  Continues to work with PT. Improving.   PT/OT  OOB w/ assistance  CIR denied. CSW help with SNF placement.   Abdominal distention 2/2 likely due to ascites  S/p 1L paracentesis. Continues to have abdominal distention. Continues to have daily BMs.   Continue to follow patient symptoms  Scheduled Senna once daily and lactulose for stool burden.   Diurese fluid with 20mg  torsemide BID, 100mg  spironolactone BID.  If not relieved with diuresis and BMs, can consider paracentesis with worsening  Strict I/O  Insomnia 4.5mg  melatonin QHS 2000    Hypertension, chronic, stable Blood pressures have been stable without medication  Holding home lisinopril hydrochlorothiazide  Hyperlipidemia  Continue to hold atorvastatin  Most recent lipid panel within normal limits  We will continue to follow outpatient  Macrocytic anemia with thrombocytopenia likely secondary to alcohol related  acute liver failure  Check CBC with differential and INR prior to expected discharge  Patient will need follow-up outpatient  Continue multivitamins, folate, thiamine  GERD  Tums as needed  Protein Calorie Malnutrition  Continue Ensure live twice daily between meals  Nutrition: Heart healthy GI ppx: Tums, senna PRN DVT ppx: Lovenox 40 mg every 24 hours  Future labs: No labs for tomorrow morning Disposition: SNF  Subjective  Patient doing well today. Upset that he was not approve for CIR. Reports having stress which continually causes his stomach to feel bad.   Objective:   Vital Signs Temp:  [98 F (36.7 C)-98.3 F (36.8 C)] 98.3 F (36.8 C) (11/14 0552) Pulse Rate:  [104-113] 113 (11/14 0552) Resp:  [16-19] 16 (11/14 0552) BP: (121-131)/(65-87) 126/73 (11/14 0552) SpO2:  [95 %-98 %] 97 % (11/14 0552) Filed Weights   06/19/18 0615 06/25/18 0404 06/26/18 0500  Weight: 78.6 kg 79.1 kg 79 kg   Physical Exam:  Gen: NAD, alert, non-toxic, well-appearing, sitting comfortably  Skin: Warm and dry HEENT: NCAT.  MMM.  CV: RRR.  Normal S1-S2. No BLEE. Resp: CTAB. No increased WOB Abd: Postive bs. Distended but not firm. Positive shifting dullness. No bulging flanks. NTTP.  Extremities: moves extremities spontaneously. Warm and well perfused.  Psych: speech fluency and mentation continue to improve. Patient continues to be angry about his previous work and holds a lot of resentment, causing him increased stress.  Laboratory: Recent Labs  Lab 06/27/18 0329  WBC 6.7  HGB 9.3*  HCT 27.4*  PLT 129*   Recent Labs  Lab 06/25/18 0503 06/27/18 0329  NA 138 137  K 3.5 3.5  CL 105 103  CO2 29 29  BUN 6 8  CREATININE 0.83 0.75  CALCIUM 8.1* 7.7*  PROT  --  6.8  BILITOT  --  4.7*  ALKPHOS  --  57  ALT  --  39  AST  --  74*  GLUCOSE 119* 133*    Imaging/Diagnostic Tests: No results found.   Melene PlanKim, Trenity Pha E, MD 06/30/2018, 6:59 AM PGY-1, [facility]  Family Medicine FPTS Intern pager: [phone], text pages welcome

## 2018-06-30 NOTE — Progress Notes (Signed)
Nutrition Follow-up  DOCUMENTATION CODES:   Not applicable  INTERVENTION:  -Continue Ensure Enlive BID   -Continue MVI daily  -D/c nutrition instructions added  NUTRITION DIAGNOSIS:   Increased nutrient needs related to chronic illness(cirrhosis, hepatitis) as evidenced by estimated needs.  Ongoing  GOAL:   Patient will meet greater than or equal to 90% of their needs  Meeting on most days with meals and Ensure supplements  MONITOR:   PO intake, Supplement acceptance, Labs, Weight trends, I & O's, Skin  ASSESSMENT:   62 y.o. male with past medical history of alcohol abuse presents for evaluation of jaundice and confusion. Pt found to have jaundice with total bilirubin of 12.8, AST 154, ALT 85, normal alkaline phosphatase.  INR 1.98.  CBC showed mild leukocytosis with WBC 13.0,  hemoglobin of 11.9 and platelet count of 118.  CT abdomen pelvis with IV contrast showed heterogenicity of the liver parenchyma and small volume ascites with differential of cirrhosis and possible concurrent acute hepatitis.  11/10 Paracentesis removed 1 L yellow fluid  Per chart DT's stable and improving   Per chart pt consuming 50-90% of most meals on low sodium heart healthy diet. Pt has been accepting Ensure supplement majority of the time.  Visited pt in room today with his mother present. Pt and mother are packing up his things; unsure of current discharge plan. Pt expresses discontent that he will not be transferred to [facility] as MD ruled he does not meet requirements for level of care. Pt reports he and his mother are exploring other rehab options but is disappointed because he has "gotten comfortable here".   Pt says he has been eating well and trying to order the healthiest options on the menu such as salads and fruit. He has also been drinking Ensure supplements as given.    Added nutrition recommendations to discharge instructions for pt which include continuing Ensure or  other oral nutrition supplement and continuing general multivitamin, B complex vitamin.   Meds: folic acid, lactulose, MVI, spironolactone, thiamine, senna Labs: bilirubin 4.7  Diet Order:   Diet Order            Diet Heart Room service appropriate? Yes; Fluid consistency: Thin  Diet effective now              EDUCATION NEEDS:   No education needs have been identified at this time  Skin:  Skin Assessment: Skin Integrity Issues: Skin Integrity Issues:: Incisions Incisions: puncture L abdomen  Last BM:  11/13  Height:   Ht Readings from Last 1 Encounters:  06/19/18 5\' 9"  (1.753 m)    Weight:   Wt Readings from Last 1 Encounters:  06/26/18 79 kg    Ideal Body Weight:  72.7 kg  BMI:  Body mass index is 25.72 kg/m.  Estimated Nutritional Needs:   Kcal:  2100-2300  Protein:  110-130 grams  Fluid:  2.1 - 2.3 L/day    Alfred Kelley, Dietetic Intern [phone]

## 2018-06-30 NOTE — Progress Notes (Signed)
Pt given discharge instructions, prescriptions, and care notes. Pt verbalized understanding AEB no further questions or concerns at this time. IV was discontinued, no redness, pain, or swelling noted at this time. Pt's skin was dry and intact. Pt left the floor via wheelchair with staff in stable condition. Pt transported by family via private auto. Report given to receiving RN.

## 2018-06-30 NOTE — Progress Notes (Signed)
CSW spoke with patient's brother, Cletis AthensJon, to update him that [facility] not [facility] any beds available, but his second choice, [facility], does. He requested CSW secure a bed there and have them begin insurance authorization. He will sign patient's admission paperwork there today while awaiting insurance approval.   Cristobal Goldmannadia Bellatrix Devonshire LCSW [phone]

## 2018-06-30 NOTE — [place] FL2 (Signed)
[place] MEDICAID FL2 LEVEL OF CARE SCREENING TOOL     IDENTIFICATION  Patient Name: Alfred StallingDavid M Kelley Birthdate: 1956/02/02 Sex: male Admission Date (Current Location): 06/17/2018  [facility] [place]  Alfred Gower([place])   Facility and Address:  [facility], [address], [place], [place] [address]      Provider Number: [id number]  Attending Physician Name and Address:  McDiarmid, Leighton Roachodd D, MD  Relative Name and Phone Number:  Alfred KudoBrother, Jon, [phone]    Current Level of Care: Hospital Recommended Level of Care: Skilled Nursing Facility Prior Approval Number:    Date Approved/Denied:   PASRR Number: [id number]  Discharge Plan: SNF    Current Diagnoses: Patient Active Problem List   Diagnosis Date Noted  . Acute on chronic anemia   . Macrocytic anemia   . Benign essential HTN   . Transaminitis   . Thrombocytopenia (HCC)   . Ascites   . Alcohol withdrawal syndrome, with delirium (HCC)   . Essential hypertension   . Hyperlipidemia   . Abdomen enlarged   . Acute liver failure 06/18/2018  . Alcohol abuse   . Liver dysfunction 06/17/2018  . Altered mental status   . Hyperbilirubinemia   . Hyponatremia     Orientation RESPIRATION BLADDER Height & Weight     Self, Place  Normal Continent Weight: 79 kg Height:  5\' 9"  (175.3 cm)  BEHAVIORAL SYMPTOMS/MOOD NEUROLOGICAL BOWEL NUTRITION STATUS      Incontinent Diet(Please see DC Summary)  AMBULATORY STATUS COMMUNICATION OF NEEDS Skin   Limited Assist Verbally Other (Comment)(Puncture on abdomen)                       Personal Care Assistance Level of Assistance  Bathing, Feeding, Dressing Bathing Assistance: Independent Feeding assistance: Independent Dressing Assistance: Independent     Functional Limitations Info  Hearing   Hearing Info: Impaired      SPECIAL CARE FACTORS FREQUENCY  PT (By licensed PT), OT (By licensed OT)     PT Frequency: 5x/week OT Frequency:  3x/week            Contractures Contractures Info: Not present    Additional Factors Info  Code Status, Allergies Code Status Info: Full Allergies Info: NKA           Current Medications (06/30/2018):  This is the current hospital active medication list Current Facility-Administered Medications  Medication Dose Route Frequency Provider Last Rate Last Dose  . 0.9 %  sodium chloride infusion   Intravenous PRN Lennox SoldersWinfrey, Amanda C, MD   Stopped at 06/18/18 1230  . acetaminophen (TYLENOL) tablet 325 mg  325 mg Oral Q6H PRN Lennox SoldersWinfrey, Amanda C, MD   325 mg at 06/29/18 1353   Or  . acetaminophen (TYLENOL) suppository 325 mg  325 mg Rectal Q6H PRN Lennox SoldersWinfrey, Amanda C, MD      . alum & mag hydroxide-simeth (MAALOX/MYLANTA) 200-200-20 MG/5ML suspension 30 mL  30 mL Oral Q4H PRN McDiarmid, Leighton Roachodd D, MD   30 mL at 06/27/18 1858  . calcium carbonate (TUMS - dosed in mg elemental calcium) chewable tablet 200 mg of elemental calcium  1 tablet Oral BID PRN Lennox SoldersWinfrey, Amanda C, MD   200 mg of elemental calcium at 06/28/18 0023  . enoxaparin (LOVENOX) injection 40 mg  40 mg Subcutaneous Q24H Lennox SoldersWinfrey, Amanda C, MD   40 mg at 06/29/18 1845  . feeding supplement (ENSURE ENLIVE) (ENSURE ENLIVE) liquid 237 mL  237 mL  Oral BID BM Lennox Solders, MD   237 mL at 06/29/18 1015  . folic acid (FOLVITE) tablet 1 mg  1 mg Oral Daily Lennox Solders, MD   1 mg at 06/29/18 1013  . lactulose (CHRONULAC) 10 GM/15ML solution 20 g  20 g Oral BID [place] Rakes, MD   20 g at 06/29/18 2217  . Melatonin TABS 4.5 mg  4.5 mg Oral QHS Melene Plan, MD   4.5 mg at 06/29/18 2218  . multivitamin with minerals tablet 1 tablet  1 tablet Oral Daily Lennox Solders, MD   1 tablet at 06/30/18 0845  . ondansetron (ZOFRAN) tablet 4 mg  4 mg Oral Q6H PRN Lennox Solders, MD   4 mg at 06/27/18 1610   Or  . ondansetron (ZOFRAN) injection 4 mg  4 mg Intravenous Q6H PRN Lennox Solders, MD   4 mg at 06/25/18 0316  . rifaximin  (XIFAXAN) tablet 550 mg  550 mg Oral BID Bernette Redbird, MD   550 mg at 06/30/18 0845  . senna (SENOKOT) tablet 8.6 mg  1 tablet Oral Daily Melene Plan, MD   8.6 mg at 06/30/18 0845  . sodium chloride flush (NS) 0.9 % injection 3 mL  3 mL Intravenous Q12H Lennox Solders, MD   3 mL at 06/30/18 0844  . spironolactone (ALDACTONE) tablet 100 mg  100 mg Oral BID Lennox Solders, MD   100 mg at 06/30/18 0843  . thiamine (VITAMIN B-1) tablet 100 mg  100 mg Oral Daily Lezlie Octave C, MD   100 mg at 06/29/18 1012   Or  . thiamine (B-1) injection 100 mg  100 mg Intravenous Daily Lennox Solders, MD   100 mg at 06/24/18 1045  . torsemide (DEMADEX) tablet 20 mg  20 mg Oral BID Lennox Solders, MD   20 mg at 06/30/18 9604     Discharge Medications: Please see discharge summary for a list of discharge medications.  Relevant Imaging Results:  Relevant Lab Results:   Additional Information SSN: [id number] [place], [place]

## 2018-06-30 NOTE — Clinical Social Work Note (Signed)
Clinical Social Work Assessment  Patient Details  Name: Isabella StallingDavid M Schillaci MRN: [id number] Date of Birth: July 12, 1956  Date of referral:  06/30/18               Reason for consult:  Facility Placement                Permission sought to share information with:  Facility Medical sales representativeContact Representative, Family Supports Permission granted to share information::  Yes, Verbal Permission Granted  Name::     Chartered certified accountantJon  Agency::  SNFs  Relationship::  Brother  Contact Information:  [phone]  Housing/Transportation Living arrangements for the past 2 months:  Single Family Home Source of Information:  Siblings, Patient Patient Interpreter Needed:  None Criminal Activity/Legal Involvement Pertinent to Current Situation/Hospitalization:  No - Comment as needed Significant Relationships:  Adult Children, Siblings Lives with:  Self Do you feel safe going back to the place where you live?  No Need for family participation in patient care:  Yes (Comment)  Care giving concerns:  CSW received consult for possible SNF placement at time of discharge since CIR is unable to accept patient. CSW spoke with patient and his brother regarding PT recommendation of SNF placement at time of discharge. Patient reported that patient's daughters are currently unable to care for patient at their home given patient's current physical needs and fall risk. Patient expressed understanding of PT recommendation and is agreeable to SNF placement at time of discharge. CSW to continue to follow and assist with discharge planning needs.   Social Worker assessment / plan:  CSW spoke with patient concerning [facility] before returning home to [place].  Employment status:  Technical [employer] Insurance information:  Managed Care PT Recommendations:  Inpatient Rehab Consult Information / Referral to community resources:  Skilled Nursing Facility  Patient/Family's Response to care:  Patient recognizes need for rehab before returning  home and is agreeable to a SNF in [place]. Patient's brother reported [facility] or [facility] since he is familiar with their staff through his church.  Patient/Family's Understanding of and Emotional Response to Diagnosis, Current Treatment, and Prognosis:  Patient/family is realistic regarding therapy needs and expressed being hopeful for SNF placement. Patient expressed understanding of CSW role and discharge process as well as medical condition. No questions/concerns about plan or treatment.    Emotional Assessment Appearance:  Appears stated age Attitude/Demeanor/Rapport:  Engaged Affect (typically observed):  Accepting, Appropriate, Pleasant Orientation:  Oriented to Self, Oriented to Place, Oriented to  Time, Oriented to Situation Alcohol / Substance use:  Alcohol Use Psych involvement (Current and /or in the community):  No (Comment)  Discharge Needs  Concerns to be addressed:  Care Coordination Readmission within the last 30 days:  No Current discharge risk:  None Barriers to Discharge:  No Barriers Identified   Mearl Latinadia S Donjuan Robison, LCSW 06/30/2018, 1:31 PM

## 2018-06-30 NOTE — Clinical Social Work Placement (Signed)
   CLINICAL SOCIAL WORK PLACEMENT  NOTE  Date:  06/30/2018  Patient Details  Name: Alfred Kelley MRN: [id number] Date of Birth: 04-27-1956  Clinical Social Work is seeking post-discharge placement for this patient at the Skilled  Nursing Facility level of care (*CSW will initial, date and re-position this form in  chart as items are completed):  Yes   Patient/family provided with [facility] Clinical Social Work Department's list of facilities offering this level of care within the geographic area requested by the patient (or if unable, by the patient's family).  Yes   Patient/family informed of their freedom to choose among providers that offer the needed level of care, that participate in Medicare, Medicaid or managed care program needed by the patient, have an available bed and are willing to accept the patient.  Yes   Patient/family informed of [facility] ownership interest in [facility] and [facility], as well as of the fact that they are under no obligation to receive care at these facilities.  PASRR submitted to EDS on 06/30/18     PASRR number received on 06/30/18     Existing PASRR number confirmed on       FL2 transmitted to all facilities in geographic area requested by pt/family on 06/30/18     FL2 transmitted to all facilities within larger geographic area on       Patient informed that his/her managed care company has contracts with or will negotiate with certain facilities, including the following:        Yes   Patient/family informed of bed offers received.  [facility]     Physician recommends and patient chooses bed at      [facility].  Patient to be transferred to facility by Brother by car     Patient family notified on 06/30/18 of transfer.  Name of family member notified:  [name]     PHYSICIAN Please sign FL2     Additional Comment:      _______________________________________________ Mearl LatinNadia S Veryl Abril, LCSW 06/30/2018, 2:38 PM

## 2018-06-30 NOTE — Progress Notes (Signed)
[facility] has received insurance approval for patient to discharge today. Will update MD.   Cristobal GoldmannNadia Kamran Coker LCSW [phone]

## 2018-06-30 NOTE — Progress Notes (Signed)
Patient will DC to: [facility] Anticipated DC date: 06/30/18 Family notified: Brother Estate agentJon Transport by: Brother by car ~4:30pm   Per MD patient ready for DC to [facility]. RN, patient, patient's family, and facility notified of DC. Discharge Summary and FL2 sent to facility. RN to call report prior to discharge [phone]). DC packet on chart. Ambulance transport requested for patient.   CSW will sign off for now as social work intervention is no longer needed. Please consult us again if new needs arise.  Cristobal GoldmannNadia Kiasha Bellin, LCSW Clinical Social Worker [phone]

## 2018-06-30 NOTE — Progress Notes (Signed)
Inpatient Rehabilitation Admissions Coordinator  I spoke with pt's sister, Biagio BorgJean Blue, by phone per her request to answer questions concerning the insurance denial for an inpt rehab admission.I answered her questions concerning medical neccesity guidelines. I have directed her to contact [place], SW to discuss his options for SNF rehab at this time. [phone]( [phone]).  Ottie GlazierBarbara Candido Flott, RN, MSN Rehab Admissions Coordinator [phone] 06/30/2018 10:05 AM

## 2018-07-01 LAB — CULTURE, BODY FLUID-BOTTLE

## 2018-07-01 LAB — CULTURE, BODY FLUID W GRAM STAIN -BOTTLE: Culture: NO GROWTH

## 2018-07-05 ENCOUNTER — Ambulatory Visit: Payer: 59 | Admitting: Family Medicine

## 2018-07-23 ENCOUNTER — Encounter (HOSPITAL_COMMUNITY): Payer: Self-pay | Admitting: Emergency Medicine

## 2018-07-23 ENCOUNTER — Emergency Department (HOSPITAL_COMMUNITY): Payer: 59

## 2018-07-23 ENCOUNTER — Inpatient Hospital Stay (HOSPITAL_COMMUNITY)
Admission: EM | Admit: 2018-07-23 | Discharge: 2018-07-25 | DRG: 439 | Discharge status: Against Medical Advice | Payer: 59 | Attending: Family Medicine | Admitting: Family Medicine

## 2018-07-23 ENCOUNTER — Other Ambulatory Visit: Payer: Self-pay

## 2018-07-23 DIAGNOSIS — E860 Dehydration: Secondary | ICD-10-CM | POA: Diagnosis present

## 2018-07-23 DIAGNOSIS — E875 Hyperkalemia: Secondary | ICD-10-CM | POA: Diagnosis present

## 2018-07-23 DIAGNOSIS — D539 Nutritional anemia, unspecified: Secondary | ICD-10-CM | POA: Diagnosis present

## 2018-07-23 DIAGNOSIS — D638 Anemia in other chronic diseases classified elsewhere: Secondary | ICD-10-CM | POA: Diagnosis present

## 2018-07-23 DIAGNOSIS — K7030 Alcoholic cirrhosis of liver without ascites: Secondary | ICD-10-CM

## 2018-07-23 DIAGNOSIS — I10 Essential (primary) hypertension: Secondary | ICD-10-CM | POA: Diagnosis present

## 2018-07-23 DIAGNOSIS — Z5329 Procedure and treatment not carried out because of patient's decision for other reasons: Secondary | ICD-10-CM | POA: Diagnosis present

## 2018-07-23 DIAGNOSIS — K7290 Hepatic failure, unspecified without coma: Secondary | ICD-10-CM | POA: Diagnosis present

## 2018-07-23 DIAGNOSIS — K810 Acute cholecystitis: Secondary | ICD-10-CM | POA: Diagnosis present

## 2018-07-23 DIAGNOSIS — R1011 Right upper quadrant pain: Secondary | ICD-10-CM

## 2018-07-23 DIAGNOSIS — E871 Hypo-osmolality and hyponatremia: Secondary | ICD-10-CM | POA: Diagnosis present

## 2018-07-23 DIAGNOSIS — K859 Acute pancreatitis without necrosis or infection, unspecified: Secondary | ICD-10-CM | POA: Diagnosis present

## 2018-07-23 DIAGNOSIS — D696 Thrombocytopenia, unspecified: Secondary | ICD-10-CM | POA: Diagnosis present

## 2018-07-23 DIAGNOSIS — K7031 Alcoholic cirrhosis of liver with ascites: Secondary | ICD-10-CM | POA: Diagnosis present

## 2018-07-23 DIAGNOSIS — Z79899 Other long term (current) drug therapy: Secondary | ICD-10-CM

## 2018-07-23 DIAGNOSIS — Z87442 Personal history of urinary calculi: Secondary | ICD-10-CM | POA: Diagnosis not present

## 2018-07-23 DIAGNOSIS — Z79890 Hormone replacement therapy (postmenopausal): Secondary | ICD-10-CM

## 2018-07-23 DIAGNOSIS — F10239 Alcohol dependence with withdrawal, unspecified: Secondary | ICD-10-CM | POA: Diagnosis present

## 2018-07-23 DIAGNOSIS — E785 Hyperlipidemia, unspecified: Secondary | ICD-10-CM | POA: Diagnosis present

## 2018-07-23 DIAGNOSIS — K8000 Calculus of gallbladder with acute cholecystitis without obstruction: Secondary | ICD-10-CM | POA: Diagnosis present

## 2018-07-23 DIAGNOSIS — K7682 Hepatic encephalopathy: Secondary | ICD-10-CM | POA: Diagnosis present

## 2018-07-23 DIAGNOSIS — K851 Biliary acute pancreatitis without necrosis or infection: Principal | ICD-10-CM | POA: Diagnosis present

## 2018-07-23 LAB — URINALYSIS, ROUTINE W REFLEX MICROSCOPIC
Bilirubin Urine: NEGATIVE
Glucose, UA: NEGATIVE mg/dL
HGB URINE DIPSTICK: NEGATIVE
Ketones, ur: NEGATIVE mg/dL
LEUKOCYTES UA: NEGATIVE
Nitrite: NEGATIVE
PROTEIN: NEGATIVE mg/dL
Specific Gravity, Urine: 1.018 (ref 1.005–1.030)
pH: 7 (ref 5.0–8.0)

## 2018-07-23 LAB — COMPREHENSIVE METABOLIC PANEL
ALT: 63 U/L — ABNORMAL HIGH (ref 0–44)
AST: 114 U/L — AB (ref 15–41)
Albumin: 2.6 g/dL — ABNORMAL LOW (ref 3.5–5.0)
Alkaline Phosphatase: 79 U/L (ref 38–126)
Anion gap: 12 (ref 5–15)
BUN: 10 mg/dL (ref 6–20)
CHLORIDE: 89 mmol/L — AB (ref 98–111)
CO2: 24 mmol/L (ref 22–32)
Calcium: 9.5 mg/dL (ref 8.9–10.3)
Creatinine, Ser: 0.99 mg/dL (ref 0.61–1.24)
Glucose, Bld: 174 mg/dL — ABNORMAL HIGH (ref 70–99)
POTASSIUM: 4.8 mmol/L (ref 3.5–5.1)
Sodium: 125 mmol/L — ABNORMAL LOW (ref 135–145)
Total Bilirubin: 5.7 mg/dL — ABNORMAL HIGH (ref 0.3–1.2)
Total Protein: 8.8 g/dL — ABNORMAL HIGH (ref 6.5–8.1)

## 2018-07-23 LAB — CBC
HEMATOCRIT: 33.7 % — AB (ref 39.0–52.0)
Hemoglobin: 11.3 g/dL — ABNORMAL LOW (ref 13.0–17.0)
MCH: 35.8 pg — ABNORMAL HIGH (ref 26.0–34.0)
MCHC: 33.5 g/dL (ref 30.0–36.0)
MCV: 106.6 fL — ABNORMAL HIGH (ref 80.0–100.0)
Platelets: 120 10*3/uL — ABNORMAL LOW (ref 150–400)
RBC: 3.16 MIL/uL — ABNORMAL LOW (ref 4.22–5.81)
RDW: 14.5 % (ref 11.5–15.5)
WBC: 20.4 10*3/uL — ABNORMAL HIGH (ref 4.0–10.5)
nRBC: 0 % (ref 0.0–0.2)

## 2018-07-23 LAB — ETHANOL: Alcohol, Ethyl (B): <10 mg/dL (ref <10)

## 2018-07-23 LAB — CBC WITH DIFFERENTIAL/PLATELET
ABS IMMATURE GRANULOCYTES: 0.35 10*3/uL — AB (ref 0.00–0.07)
BASOS PCT: 1 %
Basophils Absolute: 0.1 10*3/uL (ref 0.0–0.1)
Eosinophils Absolute: 0.2 10*3/uL (ref 0.0–0.5)
Eosinophils Relative: 1 %
HCT: 35.9 % — ABNORMAL LOW (ref 39.0–52.0)
Hemoglobin: 12.2 g/dL — ABNORMAL LOW (ref 13.0–17.0)
IMMATURE GRANULOCYTES: 2 %
Lymphocytes Relative: 6 %
Lymphs Abs: 1.3 10*3/uL (ref 0.7–4.0)
MCH: 35.6 pg — AB (ref 26.0–34.0)
MCHC: 34 g/dL (ref 30.0–36.0)
MCV: 104.7 fL — ABNORMAL HIGH (ref 80.0–100.0)
Monocytes Absolute: 1.9 10*3/uL — ABNORMAL HIGH (ref 0.1–1.0)
Monocytes Relative: 9 %
NEUTROS ABS: 18.4 10*3/uL — AB (ref 1.7–7.7)
NEUTROS PCT: 81 %
PLATELETS: 127 10*3/uL — AB (ref 150–400)
RBC: 3.43 MIL/uL — AB (ref 4.22–5.81)
RDW: 14.5 % (ref 11.5–15.5)
WBC: 22.2 10*3/uL — AB (ref 4.0–10.5)
nRBC: 0 % (ref 0.0–0.2)

## 2018-07-23 LAB — AMMONIA: Ammonia: 39 umol/L — ABNORMAL HIGH (ref 9–35)

## 2018-07-23 LAB — LACTIC ACID, PLASMA: Lactic Acid, Venous: 2.6 mmol/L (ref 0.5–1.9)

## 2018-07-23 LAB — LIPASE, BLOOD: LIPASE: 263 U/L — AB (ref 11–51)

## 2018-07-23 LAB — PROTIME-INR
INR: 1.89
Prothrombin Time: 21.5 seconds — ABNORMAL HIGH (ref 11.4–15.2)

## 2018-07-23 MED ORDER — FENTANYL CITRATE (PF) 100 MCG/2ML IJ SOLN
50.0000 ug | Freq: Once | INTRAMUSCULAR | Status: AC
  Administered 2018-07-23: 50 ug via INTRAVENOUS
  Filled 2018-07-23: qty 2

## 2018-07-23 MED ORDER — SODIUM CHLORIDE 0.9 % IV SOLN
INTRAVENOUS | Status: DC
  Administered 2018-07-23: 15:00:00 via INTRAVENOUS

## 2018-07-23 MED ORDER — FAMOTIDINE IN NACL 20-0.9 MG/50ML-% IV SOLN
20.0000 mg | Freq: Once | INTRAVENOUS | Status: AC
  Administered 2018-07-23: 20 mg via INTRAVENOUS
  Filled 2018-07-23: qty 50

## 2018-07-23 MED ORDER — ALUM & MAG HYDROXIDE-SIMETH 200-200-20 MG/5ML PO SUSP
30.0000 mL | Freq: Once | ORAL | Status: AC
  Administered 2018-07-23: 30 mL via ORAL
  Filled 2018-07-23: qty 30

## 2018-07-23 MED ORDER — ONDANSETRON HCL 4 MG/2ML IJ SOLN: 4.0000 mg | Freq: Once | INTRAMUSCULAR | Status: DC

## 2018-07-23 MED ORDER — ENOXAPARIN SODIUM 40 MG/0.4ML ~~LOC~~ SOLN
40.0000 mg | Freq: Every day | SUBCUTANEOUS | Status: DC
  Filled 2018-07-23: qty 0.4

## 2018-07-23 MED ORDER — LACTATED RINGERS IV SOLN
INTRAVENOUS | Status: DC
  Administered 2018-07-23: 21:00:00 via INTRAVENOUS

## 2018-07-23 MED ORDER — HYDROMORPHONE HCL 1 MG/ML IJ SOLN
1.0000 mg | Freq: Once | INTRAMUSCULAR | Status: AC
  Administered 2018-07-23: 1 mg via INTRAVENOUS
  Filled 2018-07-23: qty 1

## 2018-07-23 MED ORDER — ONDANSETRON HCL 4 MG/2ML IJ SOLN
4.0000 mg | Freq: Once | INTRAMUSCULAR | Status: AC
  Administered 2018-07-23: 4 mg via INTRAVENOUS
  Filled 2018-07-23: qty 2

## 2018-07-23 MED ORDER — IOHEXOL 300 MG/ML  SOLN
100.0000 mL | Freq: Once | INTRAMUSCULAR | Status: AC | PRN
  Administered 2018-07-23: 100 mL via INTRAVENOUS

## 2018-07-23 MED ORDER — METOCLOPRAMIDE HCL 5 MG/ML IJ SOLN
10.0000 mg | Freq: Once | INTRAMUSCULAR | Status: AC
  Administered 2018-07-23: 10 mg via INTRAVENOUS
  Filled 2018-07-23: qty 2

## 2018-07-23 MED ORDER — SODIUM CHLORIDE 0.9 % IV SOLN
2.0000 g | INTRAVENOUS | Status: DC
  Administered 2018-07-24: 2 g via INTRAVENOUS
  Filled 2018-07-23 (×2): qty 20

## 2018-07-23 MED ORDER — METRONIDAZOLE IN NACL 5-0.79 MG/ML-% IV SOLN
500.0000 mg | Freq: Three times a day (TID) | INTRAVENOUS | Status: DC
  Administered 2018-07-23 – 2018-07-24 (×3): 500 mg via INTRAVENOUS
  Filled 2018-07-23 (×3): qty 100

## 2018-07-23 MED ORDER — HYDROMORPHONE HCL 1 MG/ML IJ SOLN
1.0000 mg | INTRAMUSCULAR | Status: DC | PRN
  Administered 2018-07-23 – 2018-07-24 (×4): 1 mg via INTRAVENOUS
  Filled 2018-07-23 (×4): qty 1

## 2018-07-23 MED ORDER — SODIUM CHLORIDE 0.9 % IV SOLN
2.0000 g | Freq: Once | INTRAVENOUS | Status: AC
  Administered 2018-07-23: 2 g via INTRAVENOUS
  Filled 2018-07-23: qty 20

## 2018-07-23 NOTE — Consult Note (Signed)
Reason for Consult:ab  pain Referring Physician: Dr Baruch Goldmann Alfred Kelley is an 62 y.o. male.  HPI: 11 yom with history of etoh abuse now s/p intervention several weeks ago. He has had some intermitted upper ab pain. Was admitted and had a paracentesis with 1 liter off in November. Seen by [employer].  He developed diffuse abd pain that has worsened throughout the day. Nothing making it better. Some nausea and emesis.  He presented to er and was noted to have elevated tb (which is likely his baseline) and elevated lipase with ct showing nl pancreas, trace perihepatic fluid with distended gb and sludge.  His inr was 1.85 in November.    Past Medical History:  Diagnosis Date  . Alcoholism (HCC)   . Hyperlipidemia   . Hypertension   . 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 No pertinent surgical history.  No family history on file.  Social History:  reports that he has never smoked. He has never used smokeless tobacco. He reports that he drinks about 5.0 standard drinks of alcohol per week. He reports that he does not use drugs.  Allergies: No Known Allergies  Medications: I have reviewed the patient's current medications.  Results for orders placed or performed during the hospital encounter of 07/23/18 (from the past 48 hour(s))  Comprehensive metabolic panel     Status: Abnormal   Collection Time: 07/23/18  2:30 PM  Result Value Ref Range   Sodium 125 (L) 135 - 145 mmol/L   Potassium 4.8 3.5 - 5.1 mmol/L   Chloride 89 (L) 98 - 111 mmol/L   CO2 24 22 - 32 mmol/L   Glucose, Bld 174 (H) 70 - 99 mg/dL   BUN 10 6 - 20 mg/dL   Creatinine, Ser 1.61 0.61 - 1.24 mg/dL   Calcium 9.5 8.9 - 09.6 mg/dL   Total Protein 8.8 (H) 6.5 - 8.1 g/dL   Albumin 2.6 (L) 3.5 - 5.0 g/dL   AST 045 (H) 15 - 41 U/L   ALT 63 (H) 0 - 44 U/L   Alkaline Phosphatase 79 38 - 126 U/L   Total Bilirubin 5.7 (H) 0.3 - 1.2 mg/dL   GFR calc non Af Amer >60 >60  mL/min   GFR calc Af Amer >60 >60 mL/min   Anion gap 12 5 - 15    Comment: Performed at [facility], [address] [place], [place] [address]  Lipase, blood     Status: Abnormal   Collection Time: 07/23/18  2:30 PM  Result Value Ref Range   Lipase 263 (H) 11 - 51 U/L    Comment: Performed at [facility], [address], [place], [place] [address]  CBC WITH DIFFERENTIAL     Status: Abnormal   Collection Time: 07/23/18  2:30 PM  Result Value Ref Range   WBC 22.2 (H) 4.0 - 10.5 K/uL   RBC 3.43 (L) 4.22 - 5.81 MIL/uL   Hemoglobin 12.2 (L) 13.0 - 17.0 g/dL   HCT 82.9 (L) 56.2 - 13.0 %   MCV 104.7 (H) 80.0 - 100.0 fL   MCH 35.6 (H) 26.0 - 34.0 pg   MCHC 34.0 30.0 - 36.0 g/dL   RDW 86.5 78.4 - 69.6 %   Platelets 127 (L) 150 - 400 K/uL   nRBC 0.0 0.0 - 0.2 %   Neutrophils Relative % 81 %   Neutro Abs 18.4 (  H) 1.7 - 7.7 K/uL   Lymphocytes Relative 6 %   Lymphs Abs 1.3 0.7 - 4.0 K/uL   Monocytes Relative 9 %   Monocytes Absolute 1.9 (H) 0.1 - 1.0 K/uL   Eosinophils Relative 1 %   Eosinophils Absolute 0.2 0.0 - 0.5 K/uL   Basophils Relative 1 %   Basophils Absolute 0.1 0.0 - 0.1 K/uL   Immature Granulocytes 2 %   Abs Immature Granulocytes 0.35 (H) 0.00 - 0.07 K/uL    Comment: Performed at [facility], [address], [place], [place] [address]  Ethanol     Status: None   Collection Time: 07/23/18  2:30 PM  Result Value Ref Range   Alcohol, Ethyl (B) <10 <10 mg/dL    Comment: (NOTE) Lowest detectable limit for serum alcohol is 10 mg/dL. For medical purposes only. Performed at [facility], [address] [place], [place] [address]   Protime-INR     Status: Abnormal   Collection Time: 07/23/18  2:30 PM  Result Value Ref Range   Prothrombin Time 21.5 (H) 11.4 - 15.2 seconds   INR 1.89     Comment: Performed at [facility], [address], [place], [place] [address]  Urinalysis, Routine w reflex microscopic     Status: Abnormal   Collection  Time: 07/23/18  3:06 PM  Result Value Ref Range   Color, Urine AMBER (A) YELLOW    Comment: BIOCHEMICALS MAY BE AFFECTED BY COLOR   APPearance CLEAR CLEAR   Specific Gravity, Urine 1.018 1.005 - 1.030   pH 7.0 5.0 - 8.0   Glucose, UA NEGATIVE NEGATIVE mg/dL   Hgb urine dipstick NEGATIVE NEGATIVE   Bilirubin Urine NEGATIVE NEGATIVE   Ketones, ur NEGATIVE NEGATIVE mg/dL   Protein, ur NEGATIVE NEGATIVE mg/dL   Nitrite NEGATIVE NEGATIVE   Leukocytes, UA NEGATIVE NEGATIVE    Comment: Performed at [facility], [address] [place], [place] [address]    Ct Abdomen Pelvis W Contrast  Result Date: 07/23/2018 CLINICAL DATA:  Abdominal cramping with nausea and vomiting. Leukocytosis with elevated lipase. EXAM: CT ABDOMEN AND PELVIS WITH CONTRAST TECHNIQUE: Multidetector CT imaging of the abdomen and pelvis was performed using the standard protocol following bolus administration of intravenous contrast. CONTRAST:  OMNIPAQUE IOHEXOL 300 MG/ML  SOLN COMPARISON:  06/17/2018 FINDINGS: Lower chest: Coronary artery calcification is evident. Hepatobiliary: No focal abnormality within the liver parenchyma. Gallbladder is distended with dependent sludge. Extrahepatic common duct mildly distended at 7 mm diameter. Pancreas: Pancreatic parenchyma enhances throughout. No appreciable peripancreatic edema or inflammation. No dilatation of the main pancreatic duct. Spleen: No focal mass lesion. No dilatation of the main duct. No intraparenchymal cyst. No splenomegaly. No focal mass lesion. Adrenals/Urinary Tract: No adrenal nodule or mass. Right kidney unremarkable. 2 mm nonobstructing interpolar left renal stone evident. No evidence for hydroureter. The urinary bladder appears normal for the degree of distention. Stomach/Bowel: Stomach is nondistended. No gastric wall thickening. No evidence of outlet obstruction. Duodenum is normally positioned as is the ligament of Treitz. No small bowel wall thickening.  No small bowel dilatation. The terminal ileum is normal. The appendix is normal. No gross colonic mass. No colonic wall thickening. Vascular/Lymphatic: There is abdominal aortic atherosclerosis without aneurysm. There is no gastrohepatic or hepatoduodenal ligament lymphadenopathy. No intraperitoneal or retroperitoneal lymphadenopathy. No pelvic sidewall lymphadenopathy. Reproductive: The prostate gland and seminal vesicles have normal imaging features. Other: Trace perihepatic fluid fluid. There is some  thickening of the peritoneum in the posterior right abdomen and to a lesser degree in the posterior left abdomen. This was present to a more prominent extent on the previous exam. No pelvic sidewall lymphadenopathy. Musculoskeletal: No worrisome lytic or sclerotic osseous abnormality. 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 IMPRESSION: 1. No CT signs of acute pancreatitis. 2. Trace perihepatic fluid. Gallbladder is distended with dependent sludge and upper normal to mild common bile duct distention. 3. Thickening of the peritoneum in the posterior abdomen bilaterally was present previously and suggest chronic. 4. 2 mm nonobstructing left renal stone. 5.  Aortic Atherosclerois (ICD10-170.0) Electronically Signed   By: Kennith CenterEric  Mansell M.D.   On: 07/23/2018 16:43    Review of Systems  Gastrointestinal: Positive for abdominal pain, nausea and vomiting.   Blood pressure 128/79, pulse (!) 105, temperature 98 F (36.7 C), temperature source Oral, resp. rate 19, height 5\' 10"  (1.778 m), weight 74.8 kg, SpO2 97 %. Physical Exam  HENT:  Head: Normocephalic and atraumatic.  Eyes: No scleral icterus.  Cardiovascular: Tachycardia present.  Respiratory: Effort normal and breath sounds normal.  GI: Soft. He exhibits no distension. Bowel sounds are decreased. There is tenderness in the right upper quadrant, epigastric area and left upper quadrant. There is negative Murphy's sign. No hernia.  Skin: He is  not diaphoretic.    Assessment/Plan: Marjo BickerChilds c cirrhosis Possible cholecystitis Gallstone pancreatitis  -gi consult -may need mrcp to sort this out as liver disease certainly complicates this  -recommend abx  -will will follow and if surgery indicated at some point discuss this  Emelia LoronMatthew Christina Gintz 07/23/2018, 6:40 PM

## 2018-07-23 NOTE — ED Notes (Signed)
Patient transported to Ultrasound 

## 2018-07-23 NOTE — ED Provider Notes (Signed)
Assumed care from Dr. Jeraldine LootsLockwood at 1600.  CT returned with gallbladder distention, duct dilation and sludge present.  In the setting of patient's elevated lipase and elevated LFTs concern for possible gallbladder pathology.  Patient's white count is 22 and he was started on antibiotics.  He was requesting more pain and nausea medication.  Family medicine to admit.  Also spoke with Dr. Dwain SarnaWakefield with general surgery and he recommended at this time that GI be involved.  Also getting a right upper quadrant ultrasound but patient would not be a surgical candidate soon until lipase has improved.  They will consult on the patient. Also spoke with Dr. Randa EvensEdwards from GI and they will also plan on seeing the patient.   Alfred Kelley, Alfred Larusso, MD 07/23/18 [phone]

## 2018-07-23 NOTE — ED Notes (Signed)
Lab reports adding Protime-INR to previous blood draw.

## 2018-07-23 NOTE — ED Notes (Addendum)
Patient's brother became argumentative with nurse while speaking with the patient on his current pain / assessment , he stated that pt.'s response may not be accurate due to his mentation , pt. Is alert and oriented , pt.'s brother also questioned why the nurse is asking repetitive information that was asked by MD/provider earlier . Nurse explained multiple times to the brother  process / assessment ( medical questions) by care giver is a routine and standard care . Charge nurse intervened to explain process to pt.'s brother.

## 2018-07-23 NOTE — ED Provider Notes (Signed)
[facility] Provider Note   CSN: [id number] Arrival date & time: 07/23/18  1407     History   Chief Complaint Chief Complaint  Patient presents with  . Abdominal Pain    HPI Alfred Kelley is a 62 y.o. male.  HPI  Patient presents with concern of nausea, vomiting, abdominal pain. Onset is earlier today. Patient has a notable history of alcoholism, with intervention performed about 5 weeks ago, and has had no alcohol since that time. Patient was admitted to this facility, has been at rehabilitation since, and has been doing generally well. He has had ongoing titration of his medications during this period, but notes that prior to today, he had no similar abdominal pain, has been tolerant of oral intake.  When he is here with 2 companion to assist with the HPI.  Line no fever, no confusion, no difficulty speaking or breathing. However, the patient has substantial diffuse abdominal pain, possibly worse in the upper abdomen with persistent oral intolerance, nausea, and innumerable episodes of vomiting today. No ability to tolerate medication for pain relief.  Past Medical History:  Diagnosis Date  . Alcoholism (HCC)   . Hyperlipidemia   . Hypertension   . Kidney stones     Patient Active Problem List   Diagnosis Date Noted  . Acute on chronic anemia   . Macrocytic anemia   . Benign essential HTN   . Transaminitis   . Thrombocytopenia (HCC)   . Ascites   . Alcohol withdrawal syndrome, with delirium (HCC)   . Essential hypertension   . Hyperlipidemia   . Abdomen enlarged   . Acute liver failure 06/18/2018  . Alcohol abuse   . Liver dysfunction 06/17/2018  . Altered mental status   . Hyperbilirubinemia   . Hyponatremia     History reviewed. No pertinent surgical history.      Home Medications    Prior to Admission medications   Medication Sig Start Date End Date Taking? Authorizing Provider  feeding supplement, ENSURE  ENLIVE, (ENSURE ENLIVE) LIQD Take 237 mLs by mouth 2 (two) times daily between meals. 06/30/18   Garth Bignessimberlake, Kathryn, MD  folic acid (FOLVITE) 1 MG tablet Take 1 tablet (1 mg total) by mouth daily. 06/30/18   Garth Bignessimberlake, Kathryn, MD  lactulose (CHRONULAC) 10 GM/15ML solution Take 30 mLs (20 g total) by mouth 2 (two) times daily. 06/30/18   Garth Bignessimberlake, Kathryn, MD  Multiple Vitamin (MULTIVITAMIN WITH MINERALS) TABS tablet Take 1 tablet by mouth daily. 06/30/18   Garth Bignessimberlake, Kathryn, MD  rifaximin (XIFAXAN) 550 MG TABS tablet Take 1 tablet (550 mg total) by mouth 2 (two) times daily. 06/30/18   Garth Bignessimberlake, Kathryn, MD  spironolactone (ALDACTONE) 100 MG tablet Take 1 tablet (100 mg total) by mouth 2 (two) times daily. 06/30/18   Garth Bignessimberlake, Kathryn, MD  thiamine 100 MG tablet Take 1 tablet (100 mg total) by mouth daily. 06/30/18   Garth Bignessimberlake, Kathryn, MD  torsemide (DEMADEX) 20 MG tablet Take 1 tablet (20 mg total) by mouth 2 (two) times daily. 06/30/18   Garth Bignessimberlake, Kathryn, MD    Family History No family history on file.  Social History Social History   Tobacco Use  . Smoking status: Never Smoker  . Smokeless tobacco: Never Used  Substance Use Topics  . Alcohol use: Yes    Alcohol/week: 5.0 standard drinks    Types: 5 Glasses of wine per week    Comment: daily  . Drug use: Never  Allergies   Patient has no known allergies.   Review of Systems Review of Systems  Constitutional:       Per HPI, otherwise negative  HENT:       Per HPI, otherwise negative  Respiratory:       Per HPI, otherwise negative  Cardiovascular:       Per HPI, otherwise negative  Gastrointestinal: Positive for abdominal pain, nausea and vomiting.  Endocrine:       Negative aside from HPI  Genitourinary:       Neg aside from HPI   Musculoskeletal:       Per HPI, otherwise negative  Skin: Negative.   Neurological: Positive for weakness. Negative for syncope.     Physical Exam Updated Vital  Signs BP 119/72   Pulse (!) 113   Temp 98 F (36.7 C) (Oral)   Resp 18   Ht 5\' 10"  (1.778 m)   Wt 74.8 kg   SpO2 100%   BMI 23.68 kg/m   Physical Exam  Constitutional: He is oriented to person, place, and time. He appears well-developed. No distress.  Uncomfortable appearing male awake and alert  HENT:  Head: Normocephalic and atraumatic.  Eyes: Conjunctivae and EOM are normal.  Cardiovascular: Regular rhythm.  Tachycardia  Pulmonary/Chest: Effort normal. No stridor. No respiratory distress.  Abdominal: He exhibits no distension. There is tenderness in the right upper quadrant, right lower quadrant, epigastric area, periumbilical area and suprapubic area.  Musculoskeletal: He exhibits no edema.  Neurological: He is alert and oriented to person, place, and time.  Skin: Skin is warm and dry.  Psychiatric: He has a normal mood and affect.  Nursing note and vitals reviewed.    ED Treatments / Results  Labs (all labs ordered are listed, but only abnormal results are displayed) Labs Reviewed  COMPREHENSIVE METABOLIC PANEL - Abnormal; Notable for the following components:      Result Value   Sodium 125 (*)    Chloride 89 (*)    Glucose, Bld 174 (*)    Total Protein 8.8 (*)    Albumin 2.6 (*)    AST 114 (*)    ALT 63 (*)    Total Bilirubin 5.7 (*)    All other components within normal limits  LIPASE, BLOOD - Abnormal; Notable for the following components:   Lipase 263 (*)    All other components within normal limits  CBC WITH DIFFERENTIAL/PLATELET - Abnormal; Notable for the following components:   WBC 22.2 (*)    RBC 3.43 (*)    Hemoglobin 12.2 (*)    HCT 35.9 (*)    MCV 104.7 (*)    MCH 35.6 (*)    Platelets 127 (*)    Neutro Abs 18.4 (*)    Monocytes Absolute 1.9 (*)    Abs Immature Granulocytes 0.35 (*)    All other components within normal limits  ETHANOL  URINALYSIS, ROUTINE W REFLEX MICROSCOPIC    EKG None  Radiology No results  found.  Procedures Procedures (including critical care time)  Medications Ordered in ED Medications  0.9 %  sodium chloride infusion ( Intravenous New Bag/Given 07/23/18 1432)  famotidine (PEPCID) IVPB 20 mg premix (20 mg Intravenous New Bag/Given 07/23/18 1511)  fentaNYL (SUBLIMAZE) injection 50 mcg (50 mcg Intravenous Given 07/23/18 1432)  ondansetron (ZOFRAN) injection 4 mg (4 mg Intravenous Given 07/23/18 1432)  alum & mag hydroxide-simeth (MAALOX/MYLANTA) 200-200-20 MG/5ML suspension 30 mL (30 mLs Oral Given 07/23/18 1508)  Initial Impression / Assessment and Plan / ED Course  I have reviewed the triage vital signs and the nursing notes.  Pertinent labs & imaging results that were available during my care of the patient were reviewed by me and considered in my medical decision making (see chart for details).    Patient findings notable for leukocytosis, elevated lipase, and hepatic panel all elevated. CT scan pending. After the initial evaluation the patient received fluids, Zofran, fentanyl, GI cocktail, Pepcid, has had improvement in his condition.  This 62 year old male with history of alcohol abuse, presents with nausea, vomiting, abdominal pain. Patient is awake and alert, afebrile, but initial labs are notable for leukocytosis, elevated lipase, and with concern for pancreatitis versus hepatobiliary dysfunction versus infection, the patient has a CT scan pending. Dr. Anitra Lauth is aware the patient, will follow his results.   Final Clinical Impressions(s) / ED Diagnoses  Abdominal pain   Gerhard Munch, MD 07/23/18 [phone]

## 2018-07-23 NOTE — H&P (Addendum)
Family Medicine [facility] Admission History and Physical Service Pager: [phone]  Patient name: Alfred Kelley Medical record number: [id number] Date of birth: Jun 24, 1956 Age: 62 y.o. Gender: male  Primary Care Provider: Patient, No Pcp Per Consultants: Surgery, Gastroenterology Code Status: Full   Chief Complaint: abdominal pain  Assessment and Plan: Alfred Kelley is a 62 y.o. male presenting with "burning in my belly". 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   Abdominal Pain 2/2 suspected acute cholecystitis and pancreatitis: Acute.   Patient reports sudden onset progressive burning epigastric and RUQ pain this morning, associated with nausea and vomiting.  Has abstained from alcohol for > 1 month. Afebrile, tachycardic. RUQ and epigastric pain with palpation, no signs of acute abdomen. Leukocytosis of 22 with neutrophilic left shift.  Lipase 263, AST 114, ALT 63, total bili 5.7. U/A clear.  Alcohol < 10.  CT abdomen showing a distended gallbladder with sludge and mild common bile duct distention, however no signs of acute pancreatitis.  RUQ ultrasound with signs of acute cholecystitis.  Suspect that acute abdominal pain is secondary to acute cholecystitis and likely component of pancreatitis.  However, question resolving vs. early pancreatitis as CT without any evidence of inflammation. Cholecystitis/pancreatitis likely secondary to obstructing sludge vs a stone, however no visualization of the stone on imaging, may have already passed leaving resulting inflammation.  Will continue to consider choledocholithiasis, however would suspect greater dilation and elevation in liver enzymes.  Low concern for cholangitis at this time, afebrile without jaundice.  -Admit to MedSurg, attending Dr. Pollie Meyer -Surgery on board, no surgical intervention at this time and recommends  GI -Consult GI, appreciate further recommendations, discuss if need for MRCP vs ERCP -Dilaudid 1 mg every 3 as needed for abdominal pain -Zofran as needed as needed nausea -Continue IV ceftriaxone and Flagyl for empiric coverage for intrabdominal infection due to concern for cholecystitis -mIVF 125 mL/hour LR -NPO -Monitor CBC, CMP -Obtain lactic acid  Liver cirrhosis 2/2 previous alcoholism: Chronic. Meld-Na score 28.  Liver synthetic function abnormal: albumin 2.6, platelets 127, INR 1.8.  Abstained from alcohol for the last month.  Takes torsemide 20 mg twice daily, spironolactone 100 mg twice daily, lactulose, and rifaximin at home for control.  Follows with Dr. Matthias Hughs. -Consulting GI as above, appreciate recommendations - Monitor CMP - Holding home medications in setting of n.p.o. and fluid rehydration - Monitor fluid status -Obtain ammonia level   Hypertension: Chronic, Stable. SBP 120-140s.  On spironolactone and furosemide for above. - Continue to monitor BP - Holding home medications in setting of NPO  Hyponatremia: Acute. NA 125, baseline around mid 130s.  Likely hypovolemic hypotonic hyponatremia in the setting of dehydration with vomiting/acute cholecystitis, pancreatitis as above. - Fluid rehydration with 125 mL/hour LR - Monitor CMP  Macrocytic anemia: Chronic, stable. Hemoglobin 12.2, MCV 104.  Baseline around 9, suspect his hemoglobin will trend to this once he is rehydrated.  Likely in the setting of previous alcohol abuse.  B12 and folate normal in 06/2017.  Asymptomatic, no signs of bleeding. -Monitor CBC  Thrombocytopenia: Chronic, stable. Platelets 127, at baseline.  Also likely in the setting of previous chronic alcohol use.  -Monitor CBC   FEN/GI: N.p.o., bowel rest. Prophylaxis: Lovenox  Disposition: Admit to MedSurg, attending Dr. Pollie Meyer  History of Present Illness:  Alfred Kelley is a 62 y.o. male with a history of alcoholic abuse and liver  cirrhosis presenting with acute  onset upper abdominal pain, nausea, and vomiting starting this morning.  He states he is intermittently had abdominal pain in the last month, especially in the setting of his diagnosed liver cirrhosis with ascites, but this feels different than usual.  However this morning it became severe and further progressed throughout the day, ultimately causing him to seek further evaluation.  Overnight, he was eating a lot of junk food, however this morning he has minimal appetite.  He describes the pain as burning, sometimes stabbing in his epigastric, periumbilical, and right upper quadrant.  He also notes his abdomen feels full and sore.  Denies any change in his bowel movement, dysuria, melena, hematochezia.  However does admit to feeling subjectively febrile today, chills.  He previously abused alcohol, however has abstained for the past month after going through withdrawal during admission in November and then seeking rehab program afterwards.  Already follows with GI, Dr. Matthias Hughs, for his concerns.  ED Course: On arrival, he was noted to be tachypneic to 113 and in pain, however otherwise hemodynamically stable.  CT abdomen showing an distended gallbladder with dependent sludge in upper normal to mild common bile duct distention.  No signs of acute pancreatitis on CT, however lipase 263.  Liver ultrasound showing evidence of acute cholecystitis, mild enlargement of common bile duct likely related to recent passage of sludge/stones.  Labs notable for WBC 22, hemoglobin 12.2, sodium 125, lipase 263, AST 114, ALT 63, total bili 5.7.  U/a unremarkable.  Alcohol under 10.  INR 1.89.  Started on IV ceftriaxone and fluids while in the ED.  Review Of Systems: Per HPI with the following additions:  Review of Systems  Constitutional: Negative for chills and fever.  Respiratory: Negative for cough, sputum production, shortness of breath and wheezing.   Cardiovascular: Negative for chest  pain.  Gastrointestinal: Positive for abdominal pain, nausea and vomiting. Negative for constipation, diarrhea and heartburn.    Patient Active Problem List   Diagnosis Date Noted  . Acute pancreatitis 07/23/2018  . Acute on chronic anemia   . Macrocytic anemia   . Benign essential HTN   . Transaminitis   . Thrombocytopenia (HCC)   . Ascites   . Alcohol withdrawal syndrome, with delirium (HCC)   . Essential hypertension   . Hyperlipidemia   . Abdomen enlarged   . Acute liver failure 06/18/2018  . Alcohol abuse   . Liver dysfunction 06/17/2018  . Altered mental status   . Hyperbilirubinemia   . Hyponatremia     Past Medical History: Past Medical History:  Diagnosis Date  . Alcoholism (HCC)   . Hyperlipidemia   . Hypertension   . Kidney stones     Past Surgical History: History reviewed. No pertinent surgical history.  Social History: Social History   Tobacco Use  . Smoking status: Never Smoker  . Smokeless tobacco: Never Used  Substance Use Topics  . Alcohol use: Yes    Alcohol/week: 5.0 standard drinks    Types: 5 Glasses of wine per week    Comment: daily  . Drug use: Never   Please also refer to relevant sections of EMR.  Family History: No family history on file.  Allergies and Medications: No Known Allergies No current facility-administered medications on file prior to encounter.    Current Outpatient Medications on File Prior to Encounter  Medication Sig Dispense Refill  . alum & mag hydroxide-simeth (MAALOX/MYLANTA) 200-200-20 MG/5ML suspension Take 20 mLs by mouth every 6 (six) hours as needed for  indigestion or heartburn.    . lactulose (CHRONULAC) 10 GM/15ML solution Take 30 mLs (20 g total) by mouth 2 (two) times daily. (Patient taking differently: Take 20 g by mouth 3 (three) times daily. ) 240 mL 0  . MELATONIN PO Take 6 mg by mouth at bedtime.    . Multiple Vitamin (MULTIVITAMIN WITH MINERALS) TABS tablet Take 1 tablet by mouth daily. 30  tablet 3  . rifaximin (XIFAXAN) 550 MG TABS tablet Take 1 tablet (550 mg total) by mouth 2 (two) times daily. 42 tablet 3  . divalproex (DEPAKOTE) 125 MG DR tablet Take 125 mg by mouth 2 (two) times daily.    . feeding supplement, ENSURE ENLIVE, (ENSURE ENLIVE) LIQD Take 237 mLs by mouth 2 (two) times daily between meals. (Patient not taking: Reported on 07/23/2018) 237 mL 12  . folic acid (FOLVITE) 1 MG tablet Take 1 tablet (1 mg total) by mouth daily. 30 tablet 3  . spironolactone (ALDACTONE) 100 MG tablet Take 1 tablet (100 mg total) by mouth 2 (two) times daily. 60 tablet 3  . thiamine 100 MG tablet Take 1 tablet (100 mg total) by mouth daily. 30 tablet 3  . torsemide (DEMADEX) 20 MG tablet Take 1 tablet (20 mg total) by mouth 2 (two) times daily. 60 tablet 3    Objective: BP 128/79   Pulse (!) 105   Temp 98 F (36.7 C) (Oral)   Resp 19   Ht 5\' 10"  (1.778 m)   Wt 74.8 kg   SpO2 97%   BMI 23.68 kg/m  Exam: General: Alert, appears intermittently somnolent likely secondary to pain HEENT: NCAT, MM dry, no scleral icterus Cardiac: RRR no m/g/r Lungs: Clear bilaterally, no increased WOB  Abdomen: Soft, tender mainly in epigastrium and right upper quadrant, nondistended, normoactive bowel sounds.  No guarding or rebound. Msk: Moves all extremities spontaneously, no asterixis Ext: Warm, dry, 2+ distal pulses, no edema  Derm: Slight jaundice to his skin, may be baseline   Labs and Imaging: CBC BMET  Recent Labs  Lab 07/23/18 1430  WBC 22.2*  HGB 12.2*  HCT 35.9*  PLT 127*   Recent Labs  Lab 07/23/18 1430  NA 125*  K 4.8  CL 89*  CO2 24  BUN 10  CREATININE 0.99  GLUCOSE 174*  CALCIUM 9.5     Lipase: 263 Ethanol: <10 U/A: neg INR: 1.89   CT Abd/Pelvis IMPRESSION: 1. No CT signs of acute pancreatitis. 2. Trace perihepatic fluid. Gallbladder is distended with dependent sludge and upper normal to mild common bile duct distention. 3. Thickening of the peritoneum  in the posterior abdomen bilaterally was present previously and suggest chronic. 4. 2 mm nonobstructing left renal stone.  Allayne StackBeard, Samantha N, DO 07/23/2018, 8:48 PM PGY-1, [facility] FPTS Intern pager: [phone], text pages welcome  Resident Attestation  I saw and evaluated the patient, performing the key elements of the service.I personally performed or re-performed the history, physical exam, and medical decision making activities of this service and have verified that the service and findings are accurately documented in the note.I developed the management plan that is described in the note, and I agree with the content, with my edits above.  Jules Schickim Lielle Vandervort, DO PGY-2, [facility]

## 2018-07-23 NOTE — ED Notes (Signed)
Patient transported to CT 

## 2018-07-23 NOTE — ED Triage Notes (Signed)
Patient c/o nausea worsening today. Father at bedside unable to give much information, states the patients younger brother has been caring for patient and will be here shortly to give a better history. States he was admitted about 5 weeks ago r//t alcohol and his liver. Patient c.o abdominal cramping, states "I ate too much over the night." father states patient was taken off of a medication yesterday but unable to tell what medication.

## 2018-07-24 ENCOUNTER — Inpatient Hospital Stay (HOSPITAL_COMMUNITY): Payer: 59

## 2018-07-24 DIAGNOSIS — K7682 Hepatic encephalopathy: Secondary | ICD-10-CM | POA: Diagnosis present

## 2018-07-24 DIAGNOSIS — K7290 Hepatic failure, unspecified without coma: Secondary | ICD-10-CM | POA: Diagnosis present

## 2018-07-24 DIAGNOSIS — K810 Acute cholecystitis: Secondary | ICD-10-CM

## 2018-07-24 DIAGNOSIS — K7030 Alcoholic cirrhosis of liver without ascites: Secondary | ICD-10-CM

## 2018-07-24 LAB — CBC
HCT: 31.9 % — ABNORMAL LOW (ref 39.0–52.0)
HEMOGLOBIN: 11 g/dL — AB (ref 13.0–17.0)
MCH: 36.8 pg — ABNORMAL HIGH (ref 26.0–34.0)
MCHC: 34.5 g/dL (ref 30.0–36.0)
MCV: 106.7 fL — ABNORMAL HIGH (ref 80.0–100.0)
Platelets: 103 10*3/uL — ABNORMAL LOW (ref 150–400)
RBC: 2.99 MIL/uL — ABNORMAL LOW (ref 4.22–5.81)
RDW: 14.7 % (ref 11.5–15.5)
WBC: 19.1 10*3/uL — ABNORMAL HIGH (ref 4.0–10.5)
nRBC: 0 % (ref 0.0–0.2)

## 2018-07-24 LAB — BASIC METABOLIC PANEL
Anion gap: 6 (ref 5–15)
BUN: 9 mg/dL (ref 6–20)
CO2: 26 mmol/L (ref 22–32)
Calcium: 8.7 mg/dL — ABNORMAL LOW (ref 8.9–10.3)
Chloride: 94 mmol/L — ABNORMAL LOW (ref 98–111)
Creatinine, Ser: 0.84 mg/dL (ref 0.61–1.24)
GFR calc Af Amer: >60 mL/min (ref >60)
GFR calc non Af Amer: >60 mL/min (ref >60)
Glucose, Bld: 105 mg/dL — ABNORMAL HIGH (ref 70–99)
Potassium: 4.3 mmol/L (ref 3.5–5.1)
Sodium: 126 mmol/L — ABNORMAL LOW (ref 135–145)

## 2018-07-24 LAB — COMPREHENSIVE METABOLIC PANEL
ALT: 69 U/L — ABNORMAL HIGH (ref 0–44)
AST: 114 U/L — ABNORMAL HIGH (ref 15–41)
Albumin: 2.2 g/dL — ABNORMAL LOW (ref 3.5–5.0)
Alkaline Phosphatase: 60 U/L (ref 38–126)
Anion gap: 11 (ref 5–15)
BUN: 7 mg/dL (ref 6–20)
CO2: 23 mmol/L (ref 22–32)
Calcium: 9 mg/dL (ref 8.9–10.3)
Chloride: 94 mmol/L — ABNORMAL LOW (ref 98–111)
Creatinine, Ser: 0.74 mg/dL (ref 0.61–1.24)
GFR calc Af Amer: >60 mL/min (ref >60)
GFR calc non Af Amer: >60 mL/min (ref >60)
Glucose, Bld: 115 mg/dL — ABNORMAL HIGH (ref 70–99)
POTASSIUM: 5.4 mmol/L — AB (ref 3.5–5.1)
Sodium: 128 mmol/L — ABNORMAL LOW (ref 135–145)
Total Bilirubin: 6.8 mg/dL — ABNORMAL HIGH (ref 0.3–1.2)
Total Protein: 8 g/dL (ref 6.5–8.1)

## 2018-07-24 LAB — LACTIC ACID, PLASMA
Lactic Acid, Venous: 2.2 mmol/L (ref 0.5–1.9)
Lactic Acid, Venous: 2.1 mmol/L (ref 0.5–1.9)

## 2018-07-24 MED ORDER — GADOBUTROL 1 MMOL/ML IV SOLN
5.0000 mL | Freq: Once | INTRAVENOUS | Status: AC | PRN
  Administered 2018-07-24: 5 mL via INTRAVENOUS

## 2018-07-24 MED ORDER — SENNA 8.6 MG PO TABS
1.0000 | ORAL_TABLET | Freq: Every day | ORAL | Status: DC
  Administered 2018-07-24: 8.6 mg via ORAL
  Filled 2018-07-24: qty 1

## 2018-07-24 MED ORDER — LACTULOSE 10 GM/15ML PO SOLN
20.0000 g | Freq: Two times a day (BID) | ORAL | Status: DC
  Administered 2018-07-24: 20 g via ORAL
  Filled 2018-07-24 (×2): qty 30

## 2018-07-24 MED ORDER — ENOXAPARIN SODIUM 40 MG/0.4ML ~~LOC~~ SOLN: 40.0000 mg | SUBCUTANEOUS | Status: DC

## 2018-07-24 MED ORDER — CALCIUM CARBONATE ANTACID 500 MG PO CHEW
1.0000 | CHEWABLE_TABLET | Freq: Two times a day (BID) | ORAL | Status: DC | PRN
  Filled 2018-07-24: qty 1

## 2018-07-24 MED ORDER — THIAMINE HCL 100 MG/ML IJ SOLN: 100.0000 mg | Freq: Every day | INTRAMUSCULAR | Status: DC

## 2018-07-24 MED ORDER — TORSEMIDE 20 MG PO TABS
20.0000 mg | ORAL_TABLET | Freq: Two times a day (BID) | ORAL | Status: DC
  Administered 2018-07-24 – 2018-07-25 (×2): 20 mg via ORAL
  Filled 2018-07-24 (×2): qty 1

## 2018-07-24 MED ORDER — RIFAXIMIN 550 MG PO TABS
550.0000 mg | ORAL_TABLET | Freq: Two times a day (BID) | ORAL | Status: DC
  Administered 2018-07-24: 550 mg via ORAL
  Filled 2018-07-24 (×2): qty 1

## 2018-07-24 MED ORDER — FOLIC ACID 1 MG PO TABS: 1.0000 mg | ORAL_TABLET | Freq: Every day | ORAL | Status: DC

## 2018-07-24 MED ORDER — ACETAMINOPHEN 325 MG PO TABS: 325.0000 mg | ORAL_TABLET | Freq: Four times a day (QID) | ORAL | Status: DC | PRN

## 2018-07-24 MED ORDER — ONDANSETRON HCL 4 MG PO TABS: 4.0000 mg | ORAL_TABLET | Freq: Four times a day (QID) | ORAL | Status: DC | PRN

## 2018-07-24 MED ORDER — ENSURE ENLIVE PO LIQD
237.0000 mL | Freq: Two times a day (BID) | ORAL | Status: DC
  Filled 2018-07-24: qty 237

## 2018-07-24 MED ORDER — VITAMIN B-1 100 MG PO TABS
100.0000 mg | ORAL_TABLET | Freq: Every day | ORAL | Status: DC
  Administered 2018-07-24: 100 mg via ORAL
  Filled 2018-07-24: qty 1

## 2018-07-24 MED ORDER — SODIUM CHLORIDE 0.9 % IV SOLN
INTRAVENOUS | Status: DC
  Administered 2018-07-24 (×2): via INTRAVENOUS

## 2018-07-24 MED ORDER — ACETAMINOPHEN 650 MG RE SUPP: 325.0000 mg | Freq: Four times a day (QID) | RECTAL | Status: DC | PRN

## 2018-07-24 MED ORDER — HYDROMORPHONE HCL 1 MG/ML IJ SOLN: 0.5000 mg | INTRAMUSCULAR | Status: DC | PRN

## 2018-07-24 MED ORDER — METRONIDAZOLE IN NACL 5-0.79 MG/ML-% IV SOLN
500.0000 mg | Freq: Three times a day (TID) | INTRAVENOUS | Status: DC
  Administered 2018-07-24 – 2018-07-25 (×2): 500 mg via INTRAVENOUS
  Filled 2018-07-24 (×2): qty 100

## 2018-07-24 MED ORDER — SPIRONOLACTONE 100 MG PO TABS: 100.0000 mg | ORAL_TABLET | Freq: Two times a day (BID) | ORAL | Status: DC

## 2018-07-24 MED ORDER — ALUM & MAG HYDROXIDE-SIMETH 200-200-20 MG/5ML PO SUSP: 30.0000 mL | ORAL | Status: DC | PRN

## 2018-07-24 MED ORDER — ONDANSETRON HCL 4 MG/2ML IJ SOLN
4.0000 mg | Freq: Four times a day (QID) | INTRAMUSCULAR | Status: DC | PRN
  Administered 2018-07-24: 4 mg via INTRAVENOUS
  Filled 2018-07-24: qty 2

## 2018-07-24 MED ORDER — ADULT MULTIVITAMIN W/MINERALS CH
1.0000 | ORAL_TABLET | Freq: Every day | ORAL | Status: DC
  Administered 2018-07-24: 1 via ORAL
  Filled 2018-07-24: qty 1

## 2018-07-24 NOTE — ED Notes (Signed)
Pt advised not to eat.

## 2018-07-24 NOTE — ED Notes (Signed)
Called pharmacy to verify and send.

## 2018-07-24 NOTE — Progress Notes (Signed)
[facility] Progress Note     Subjective: CC: pancreatitis Patient states abdominal pain is better with pain medication. Denies nausea, asking when he might be able to eat. Trying to understand pathophys of all this and repetitively asking if he is having surgery today.   Objective: Vital signs in last 24 hours: Temp:  [98 F (36.7 C)] 98 F (36.7 C) (12/07 1418) Pulse Rate:  [101-114] 103 (12/08 0650) Resp:  [16-19] 18 (12/08 0650) BP: (103-147)/(57-81) 117/66 (12/08 0650) SpO2:  [94 %-100 %] 99 % (12/08 0650) Weight:  [74.8 kg] 74.8 kg (12/07 1427)    Intake/Output from previous day: 12/07 0701 - 12/08 0700 In: 906.3 [I.V.:656.3; IV Piggyback:250] Out: -  Intake/Output this shift: No intake/output data recorded.  PE: Gen:  Alert, NAD, pleasant Card:  Regular rate and rhythm, pedal pulses 2+ BL Pulm:  Normal effort, clear to auscultation bilaterally Abd: Soft, mild TTP in epigastrium, non-distended, bowel sounds present Skin: warm and dry, no rashes  Psych: appears anxious  Lab Results:  Recent Labs    07/23/18 1945 07/24/18 0245  WBC 20.4* 19.1*  HGB 11.3* 11.0*  HCT 33.7* 31.9*  PLT 120* 103*   BMET Recent Labs    07/23/18 1430 07/24/18 0245  NA 125* 128*  K 4.8 5.4*  CL 89* 94*  CO2 24 23  GLUCOSE 174* 115*  BUN 10 7  CREATININE 0.99 0.74  CALCIUM 9.5 9.0   PT/INR Recent Labs    07/23/18 1430  LABPROT 21.5*  INR 1.89   CMP     Component Value Date/Time   NA 128 (L) 07/24/2018 0245   K 5.4 (H) 07/24/2018 0245   CL 94 (L) 07/24/2018 0245   CO2 23 07/24/2018 0245   GLUCOSE 115 (H) 07/24/2018 0245   BUN 7 07/24/2018 0245   CREATININE 0.74 07/24/2018 0245   CALCIUM 9.0 07/24/2018 0245   PROT 8.0 07/24/2018 0245   ALBUMIN 2.2 (L) 07/24/2018 0245   AST 114 (H) 07/24/2018 0245   ALT 69 (H) 07/24/2018 0245   ALKPHOS 60 07/24/2018 0245   BILITOT 6.8 (H) 07/24/2018 0245   GFRNONAA >60 07/24/2018 0245   GFRAA >60 07/24/2018 0245    Lipase     Component Value Date/Time   LIPASE 263 (H) 07/23/2018 1430       Studies/Results: Ct Abdomen Pelvis W Contrast  Result Date: 07/23/2018 CLINICAL DATA:  Abdominal cramping with nausea and vomiting. Leukocytosis with elevated lipase. EXAM: CT ABDOMEN AND PELVIS WITH CONTRAST TECHNIQUE: Multidetector CT imaging of the abdomen and pelvis was performed using the standard protocol following bolus administration of intravenous contrast. CONTRAST:  100mL OMNIPAQUE IOHEXOL 300 MG/ML  SOLN COMPARISON:  06/17/2018 FINDINGS: Lower chest: Coronary artery calcification is evident. Hepatobiliary: No focal abnormality within the liver parenchyma. Gallbladder is distended with dependent sludge. Extrahepatic common duct mildly distended at 7 mm diameter. Pancreas: Pancreatic parenchyma enhances throughout. No appreciable peripancreatic edema or inflammation. No dilatation of the main pancreatic duct. Spleen: No focal mass lesion. No dilatation of the main duct. No intraparenchymal cyst. No splenomegaly. No focal mass lesion. Adrenals/Urinary Tract: No adrenal nodule or mass. Right kidney unremarkable. 2 mm nonobstructing interpolar left renal stone evident. No evidence for hydroureter. The urinary bladder appears normal for the degree of distention. Stomach/Bowel: Stomach is nondistended. No gastric wall thickening. No evidence of outlet obstruction. Duodenum is normally positioned as is the ligament of Treitz. No small bowel wall thickening. No small bowel dilatation. The terminal  ileum is normal. The appendix is normal. No gross colonic mass. No colonic wall thickening. Vascular/Lymphatic: There is abdominal aortic atherosclerosis without aneurysm. There is no gastrohepatic or hepatoduodenal ligament lymphadenopathy. No intraperitoneal or retroperitoneal lymphadenopathy. No pelvic sidewall lymphadenopathy. Reproductive: The prostate gland and seminal vesicles have normal imaging features. Other: Trace  perihepatic fluid fluid. There is some thickening of the peritoneum in the posterior right abdomen and to a lesser degree in the posterior left abdomen. This was present to a more prominent extent on the previous exam. No pelvic sidewall lymphadenopathy. Musculoskeletal: No worrisome lytic or sclerotic osseous abnormality. 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 IMPRESSION: 1. No CT signs of acute pancreatitis. 2. Trace perihepatic fluid. Gallbladder is distended with dependent sludge and upper normal to mild common bile duct distention. 3. Thickening of the peritoneum in the posterior abdomen bilaterally was present previously and suggest chronic. 4. 2 mm nonobstructing left renal stone. 5.  Aortic Atherosclerois (ICD10-170.0) Electronically Signed   By: Kennith Center M.D.   On: 07/23/2018 16:43   US Abdomen Limited Ruq  Result Date: 07/23/2018 CLINICAL DATA:  RIGHT upper quadrant pain. History of alcoholism and kidney stone. EXAM: ULTRASOUND ABDOMEN LIMITED RIGHT UPPER QUADRANT COMPARISON:  CT abdomen and pelvis July 23, 2018 FINDINGS: Gallbladder: Echogenic sludge ball at the gallbladder neck without discrete cholelithiasis. Mild gallbladder distension without gallbladder wall thickening. Trace pericholecystic fluid. Sonographic Murphy sign not elicited though patient was reportedly on pain medication. Common bile duct: Diameter: 8 mm Liver: No focal lesion identified. Within normal limits in parenchymal echogenicity. Portal vein is patent on color Doppler imaging with normal direction of blood flow towards the liver. IMPRESSION: 1. Gallbladder sludge and trace pericholecystic fluid seen with acute cholecystitis, however limited assessment for sonographic Murphy sign as patient was reportedly on pain medication. 2. Mildly enlarged common bile duct, this could be related to recent passage of sludge/stones. Electronically Signed   By: Awilda Metro M.D.   On: 07/23/2018 18:41     Anti-infectives: Anti-infectives (From admission, onward)   Start     Dose/Rate Route Frequency Ordered Stop   07/24/18 1700  cefTRIAXone (ROCEPHIN) 2 g in sodium chloride 0.9 % 100 mL IVPB     2 g 200 mL/hr over 30 Minutes Intravenous Every 24 hours 07/23/18 1948     07/23/18 2000  metroNIDAZOLE (FLAGYL) IVPB 500 mg     500 mg 100 mL/hr over 60 Minutes Intravenous Every 8 hours 07/23/18 1948     07/23/18 1715  cefTRIAXone (ROCEPHIN) 2 g in sodium chloride 0.9 % 100 mL IVPB     2 g 200 mL/hr over 30 Minutes Intravenous  Once 07/23/18 1700 07/23/18 1744       Assessment/Plan HTN Childs C cirrhosis - ammonia elevated at 39, may need some lactulose either via enema or PO Hyponatremia Anemia of chronic disease Thrombocytopenia  Possible cholecystitis Gallstone pancreatitis - GI consult pending - WBC 19 from 20, continue abx - Tbili 6.8, LFTs chronically elevated - will need to figure out if once pancreatitis better, if patient could tolerate cholecystectomy - we will continue to follow with you  - ok for CLD from a surgery standpoint, but would await GI recs  FEN: NPO, IVF VTE: SCDs, lovenox ID: [id number] 12/7>>  LOS: 1 day    Wells Guiles , [facility] Surgery 07/24/2018, 7:34 AM Pager: [phone] Consults: [phone] Mon-Fri 7:00 am-4:30 pm Sat-Sun 7:00 am-11:30 am

## 2018-07-24 NOTE — Progress Notes (Signed)
Family Medicine Teaching Service Daily Progress Note Intern Pager: [phone]  Patient name: Alfred StallingDavid M Kelley Medical record number: [id number] Date of birth: 11-01-1955 Age: 62 y.o. Gender: male  Primary Care Provider: Patient, No Pcp Per Consultants: GI, surgery Code Status: Full code  Pt Overview and Major Events to Date:  Hospital Day 1 Admitted: 07/23/2018   Assessment and Plan: Alfred Kelley is a 62 y.o. male who presented with abdominal pain concerning for panc/cholecystitis. Marland Kitchen. 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    cholecystitis/pancreatitis - this morning patient states his abdominal pain is better.  He was mildly tender to palpation in RUQ and epigastric region.  RUQ showed sludge, enalrged CBD and trace cholecystic fluid.  Lipase was 263.  Wbc 19.1. No stones seen obstructing pancrase or gallbaldder. Possible it has already passed.  Patient is afebrile.  Receiving antibiotics and awaiting GI.  - continue CTX and flagyl - surgery consulted, no surgical intervention at this time.  - GI consulted, recommending MRCP - dilaudid 1mg  q3h for pain. - NPO/mIVF 14925ml/hr - trend LA to normal.    Liver cirrhosis 2/2 previous alcoholism - preexisting. Patient has not had alcohol in 30 days he states.  At home takes torsemide 20mg  twice daily, spironolactone, 100mg  BID, lactulose, rifaxamine.  Follows with Dr. Matthias HughsBuccini.  meld- Na score of 28.  INR 1.8, platelets 127, AST 114, ALT 69, ammonia 39.   - GI following.  - AM CMP -holding home meds while NPO>   HTN: chronic stable - most recent BP's < 120. Holding meds for now.  - holding home meds while NPO  Hyponatremia -  128 this morning after receiving LR.  Switching to NS from LR due to hyperkalemia - 125 ml/hr NS - AM CMP  Hyperkalemia  - 5.4 this am.  Switching from LR to NS mIVF.  - AM CMP  Macrocytic anemia: Chronic,  stable. Hemoglobin 12.2, MCV 104.  Baseline around 9.  Continue to monitor.  -Monitor CBC  Thrombocytopenia: Chronic, stable. Platelets 127, at baseline.  Also likely in the setting of previous chronic alcohol use.  -Monitor CBC    Fluids: 12625ml/hr NS   . cefTRIAXone (ROCEPHIN)  IV    . lactated ringers 125 mL/hr at 07/23/18 2059  . metronidazole Stopped (07/24/18 0544)   Electrolytes: replete PRN  Nutrition: NPO GI ppx: none DVT px: lovenox 40mg  daily  Disposition: uncertain. Likely home   Medications: Scheduled Meds: . enoxaparin (LOVENOX) injection  40 mg Subcutaneous Daily  . enoxaparin (LOVENOX) injection  40 mg Subcutaneous Q24H  . enoxaparin (LOVENOX) injection  40 mg Subcutaneous Q24H  . feeding supplement (ENSURE ENLIVE)  237 mL Oral BID BM  . folic acid  1 mg Oral Daily  . lactulose  20 g Oral BID  . multivitamin with minerals  1 tablet Oral Daily  . rifaximin  550 mg Oral BID  . senna  1 tablet Oral Daily  . spironolactone  100 mg Oral BID  . thiamine  100 mg Oral Daily   Or  . thiamine  100 mg Intravenous Daily  . torsemide  20 mg Oral BID   Continuous Infusions: . cefTRIAXone (ROCEPHIN)  IV    . lactated ringers 125 mL/hr at 07/23/18 2059  . metronidazole Stopped (07/24/18 0544)   PRN Meds: acetaminophen **OR** acetaminophen, alum & mag hydroxide-simeth, calcium carbonate, HYDROmorphone (DILAUDID) injection, ondansetron **OR** ondansetron (ZOFRAN) IV  ================================================= ================================================= Subjective:  Patient is only having mild pain currently.  He would like to eat but understands he cannot right now.  Daughter states he has had these stomach pains and fullness for a month or so and will force himself to vomit.    Objective: Temp:  [98 F (36.7 C)] 98 F (36.7 C) (12/07 1418) Pulse Rate:  [101-114] 103 (12/08 0650) Resp:  [16-19] 18 (12/08 0650) BP: (103-147)/(57-81) 117/66 (12/08  0650) SpO2:  [94 %-100 %] 99 % (12/08 0650) Weight:  [74.8 kg] 74.8 kg (12/07 1427) Intake/Output 12/07 0701 - 12/08 0700 In: 906.3 [I.V.:656.3; IV Piggyback:250] Out: -  Physical Exam:  Gen: NAD, alert, non-toxic, well-nourished, well-appearing, sitting comfortably  HEENT:   Mild scleral icterus seen biaterally.   CV: Regular rate and rhythm.  Normal S1-S2.   Normal capillary refill bilaterally.  Radial pulses 2+ bilaterally. No bilateral lower extremity edema. Resp: Clear to auscultation bilaterally.  No wheezing, rales, abnormal lung sounds.  No increased work of breathing appreciated. Abd: very mildly tender to palpation in RUQ and epigatric region.  Hypoactive bowel sounds. Psych: Cooperative with exam. Pleasant. Makes eye contact.     Laboratory: Recent Labs  Lab 07/23/18 1430 07/23/18 1945 07/24/18 0245  WBC 22.2* 20.4* 19.1*  HGB 12.2* 11.3* 11.0*  HCT 35.9* 33.7* 31.9*  PLT 127* 120* 103*   Recent Labs  Lab 07/23/18 1430 07/24/18 0245  NA 125* 128*  K 4.8 5.4*  CL 89* 94*  CO2 24 23  BUN 10 7  CREATININE 0.99 0.74  CALCIUM 9.5 9.0  PROT 8.8* 8.0  BILITOT 5.7* 6.8*  ALKPHOS 79 60  ALT 63* 69*  AST 114* 114*  GLUCOSE 174* 115*    Imaging/Diagnostic Tests: Ct Abdomen Pelvis W Contrast  Result Date: 07/23/2018 CLINICAL DATA:  Abdominal cramping with nausea and vomiting. Leukocytosis with elevated lipase. EXAM: CT ABDOMEN AND PELVIS WITH CONTRAST TECHNIQUE: Multidetector CT imaging of the abdomen and pelvis was performed using the standard protocol following bolus administration of intravenous contrast. CONTRAST:  OMNIPAQUE IOHEXOL 300 MG/ML  SOLN COMPARISON:  06/17/2018 FINDINGS: Lower chest: Coronary artery calcification is evident. Hepatobiliary: No focal abnormality within the liver parenchyma. Gallbladder is distended with dependent sludge. Extrahepatic common duct mildly distended at 7 mm diameter. Pancreas: Pancreatic parenchyma enhances throughout.  No appreciable peripancreatic edema or inflammation. No dilatation of the main pancreatic duct. Spleen: No focal mass lesion. No dilatation of the main duct. No intraparenchymal cyst. No splenomegaly. No focal mass lesion. Adrenals/Urinary Tract: No adrenal nodule or mass. Right kidney unremarkable. 2 mm nonobstructing interpolar left renal stone evident. No evidence for hydroureter. The urinary bladder appears normal for the degree of distention. Stomach/Bowel: Stomach is nondistended. No gastric wall thickening. No evidence of outlet obstruction. Duodenum is normally positioned as is the ligament of Treitz. No small bowel wall thickening. No small bowel dilatation. The terminal ileum is normal. The appendix is normal. No gross colonic mass. No colonic wall thickening. Vascular/Lymphatic: There is abdominal aortic atherosclerosis without aneurysm. There is no gastrohepatic or hepatoduodenal ligament lymphadenopathy. No intraperitoneal or retroperitoneal lymphadenopathy. No pelvic sidewall lymphadenopathy. Reproductive: The prostate gland and seminal vesicles have normal imaging features. Other: Trace perihepatic fluid fluid. There is some thickening of the peritoneum in the posterior right abdomen and to a lesser degree in the posterior left abdomen. This was present to a more prominent extent on the previous exam. No pelvic sidewall lymphadenopathy. Musculoskeletal: No worrisome lytic or sclerotic osseous abnormality. 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 IMPRESSION: 1. No CT signs of acute pancreatitis. 2. Trace perihepatic fluid. Gallbladder is distended with dependent sludge and upper normal to mild common bile duct distention. 3. Thickening of the peritoneum in the posterior abdomen bilaterally was present previously and suggest chronic. 4. 2 mm nonobstructing left renal stone. 5.  Aortic Atherosclerois (ICD10-170.0) Electronically Signed   By: Kennith Center M.D.   On: 07/23/2018 16:43    US Abdomen Limited Ruq  Result Date: 07/23/2018 CLINICAL DATA:  RIGHT upper quadrant pain. History of alcoholism and kidney stone. EXAM: ULTRASOUND ABDOMEN LIMITED RIGHT UPPER QUADRANT COMPARISON:  CT abdomen and pelvis July 23, 2018 FINDINGS: Gallbladder: Echogenic sludge ball at the gallbladder neck without discrete cholelithiasis. Mild gallbladder distension without gallbladder wall thickening. Trace pericholecystic fluid. Sonographic Murphy sign not elicited though patient was reportedly on pain medication. Common bile duct: Diameter: 8 mm Liver: No focal lesion identified. Within normal limits in parenchymal echogenicity. Portal vein is patent on color Doppler imaging with normal direction of blood flow towards the liver. IMPRESSION: 1. Gallbladder sludge and trace pericholecystic fluid seen with acute cholecystitis, however limited assessment for sonographic Murphy sign as patient was reportedly on pain medication. 2. Mildly enlarged common bile duct, this could be related to recent passage of sludge/stones. Electronically Signed   By: Awilda Metro M.D.   On: 07/23/2018 18:41      Sandre Kitty, MD 07/24/2018, 9:39 AM PGY-1, [facility] FPTS Intern pager: [phone], text pages welcome

## 2018-07-24 NOTE — ED Notes (Signed)
Attempted report 

## 2018-07-24 NOTE — ED Notes (Signed)
Alfred Kelley, daughter power of a, [phone]

## 2018-07-24 NOTE — Progress Notes (Signed)
CRITICAL VALUE ALERT  Critical Value:  Lactic Acid 2.2   Date & Time Notied:  07/24/2018 at 1630   Provider Notified: Family Medicine pager [phone] notified of value   Orders Received/Actions taken: awaiting orders

## 2018-07-24 NOTE — ED Notes (Signed)
Brother's number: [phone]

## 2018-07-24 NOTE — ED Notes (Signed)
Pt taken to MRI.  Flagyl held per MRI tech.

## 2018-07-24 NOTE — Progress Notes (Signed)
CRITICAL VALUE ALERT  Critical Value:  Lactic acid  Date & Time Notied:  07/24/18 2016  Provider Notified: yes  Orders Received/Actions taken: continue to trend labs

## 2018-07-24 NOTE — Consult Note (Signed)
[facility] CONSULT Reason for consult: Pancreatitis Referring Physician: ER.  GI: Dr. Donney Dice Alfred Kelley is an 63 y.o. male.  HPI: He was hospitalized with jaundice and marked elevation of LFTs increasing ascites and.  He had a heavy history of alcohol intake.  He improved dramatically and was discharged.He was discharged about a month ago to the facility and states that he has not had any alcohol now approximately 6 weeks.  He did go through some alcohol withdrawal here in the hospital.  He came back into the emergency room with some upper abdominal discomfort.  Again, he reiterates that he has had no alcohol 6 weeks.  His labs revealed lipase 263.  His LFTs were about the same with AST 114, ALT 69, albumin 2.2, total bilirubin 6.8 which is up slightly.  WBC was elevated at 19.1.  Recent CT which showed no gross abnormality of the liver with a distended gallbladder sludge.  There was minimal distention of CBD pancreas did not show any obvious inflammation or ductal dilatation.  Ultrasound was pending which showed biliary sludge the gallbladder neck with mild distention and enlarged CBD patient denies pain.  Past Medical History:  Diagnosis Date  . Alcoholism (HCC)   . Hyperlipidemia   . Hypertension   . Kidney stones     History reviewed. No pertinent surgical history.  No family history on file.  Social History:  reports that he has never smoked. He has never used smokeless tobacco. He reports that he drinks about 5.0 standard drinks of alcohol per week. He reports that he does not use drugs.  Allergies: No Known Allergies  Medications; Prior to Admission medications   Medication Sig Start Date End Date Taking? Authorizing Provider  alum & mag hydroxide-simeth (MAALOX/MYLANTA) 200-200-20 MG/5ML suspension Take 20 mLs by mouth every 6 (six) hours as needed for indigestion or heartburn.   Yes [provider]  lactulose (CHRONULAC) 10 GM/15ML solution Take 30  mLs (20 g total) by mouth 2 (two) times daily. Patient taking differently: Take 20 g by mouth 3 (three) times daily.  06/30/18  Yes Garth Bigness, MD  MELATONIN PO Take 6 mg by mouth at bedtime.   Yes [provider]  Multiple Vitamin (MULTIVITAMIN WITH MINERALS) TABS tablet Take 1 tablet by mouth daily. 06/30/18  Yes Garth Bigness, MD  rifaximin (XIFAXAN) 550 MG TABS tablet Take 1 tablet (550 mg total) by mouth 2 (two) times daily. 06/30/18  Yes Garth Bigness, MD  divalproex (DEPAKOTE) 125 MG DR tablet Take 125 mg by mouth 2 (two) times daily.    [provider]  feeding supplement, ENSURE ENLIVE, (ENSURE ENLIVE) LIQD Take 237 mLs by mouth 2 (two) times daily between meals. Patient not taking: Reported on 07/23/2018 06/30/18   Garth Bigness, MD  folic acid (FOLVITE) 1 MG tablet Take 1 tablet (1 mg total) by mouth daily. 06/30/18   Garth Bigness, MD  spironolactone (ALDACTONE) 100 MG tablet Take 1 tablet (100 mg total) by mouth 2 (two) times daily. 06/30/18   Garth Bigness, MD  thiamine 100 MG tablet Take 1 tablet (100 mg total) by mouth daily. 06/30/18   Garth Bigness, MD  torsemide (DEMADEX) 20 MG tablet Take 1 tablet (20 mg total) by mouth 2 (two) times daily. 06/30/18   Garth Bigness, MD   . enoxaparin (LOVENOX) injection  40 mg Subcutaneous Daily  . enoxaparin (LOVENOX) injection  40 mg Subcutaneous Q24H  . enoxaparin (LOVENOX) injection  40 mg Subcutaneous  Q24H  . feeding supplement (ENSURE ENLIVE)  237 mL Oral BID BM  . folic acid  1 mg Oral Daily  . lactulose  20 g Oral BID  . multivitamin with minerals  1 tablet Oral Daily  . rifaximin  550 mg Oral BID  . senna  1 tablet Oral Daily  . spironolactone  100 mg Oral BID  . thiamine  100 mg Oral Daily   Or  . thiamine  100 mg Intravenous Daily  . torsemide  20 mg Oral BID   PRN Meds acetaminophen **OR** acetaminophen, alum & mag hydroxide-simeth, calcium carbonate,  HYDROmorphone (DILAUDID) injection, ondansetron **OR** ondansetron (ZOFRAN) IV Results for orders placed or performed during the hospital encounter of 07/23/18 (from the past 48 hour(s))  Comprehensive metabolic panel     Status: Abnormal   Collection Time: 07/23/18  2:30 PM  Result Value Ref Range   Sodium 125 (L) 135 - 145 mmol/L   Potassium 4.8 3.5 - 5.1 mmol/L   Chloride 89 (L) 98 - 111 mmol/L   CO2 24 22 - 32 mmol/L   Glucose, Bld 174 (H) 70 - 99 mg/dL   BUN 10 6 - 20 mg/dL   Creatinine, Ser 1.61 0.61 - 1.24 mg/dL   Calcium 9.5 8.9 - 09.6 mg/dL   Total Protein 8.8 (H) 6.5 - 8.1 g/dL   Albumin 2.6 (L) 3.5 - 5.0 g/dL   AST 045 (H) 15 - 41 U/L   ALT 63 (H) 0 - 44 U/L   Alkaline Phosphatase 79 38 - 126 U/L   Total Bilirubin 5.7 (H) 0.3 - 1.2 mg/dL   GFR calc non Af Amer >60 >60 mL/min   GFR calc Af Amer >60 >60 mL/min   Anion gap 12 5 - 15    Comment: Performed at [facility], [address] [place], [place] [address]  Lipase, blood     Status: Abnormal   Collection Time: 07/23/18  2:30 PM  Result Value Ref Range   Lipase 263 (H) 11 - 51 U/L    Comment: Performed at [facility], [address], [place], [place] [address]  CBC WITH DIFFERENTIAL     Status: Abnormal   Collection Time: 07/23/18  2:30 PM  Result Value Ref Range   WBC 22.2 (H) 4.0 - 10.5 K/uL   RBC 3.43 (L) 4.22 - 5.81 MIL/uL   Hemoglobin 12.2 (L) 13.0 - 17.0 g/dL   HCT 82.9 (L) 56.2 - 13.0 %   MCV 104.7 (H) 80.0 - 100.0 fL   MCH 35.6 (H) 26.0 - 34.0 pg   MCHC 34.0 30.0 - 36.0 g/dL   RDW 86.5 78.4 - 69.6 %   Platelets 127 (L) 150 - 400 K/uL   nRBC 0.0 0.0 - 0.2 %   Neutrophils Relative % 81 %   Neutro Abs 18.4 (H) 1.7 - 7.7 K/uL   Lymphocytes Relative 6 %   Lymphs Abs 1.3 0.7 - 4.0 K/uL   Monocytes Relative 9 %   Monocytes Absolute 1.9 (H) 0.1 - 1.0 K/uL   Eosinophils Relative 1 %   Eosinophils Absolute 0.2 0.0 - 0.5 K/uL   Basophils Relative 1 %   Basophils Absolute 0.1 0.0 - 0.1 K/uL    Immature Granulocytes 2 %   Abs Immature Granulocytes 0.35 (H) 0.00 - 0.07 K/uL    Comment: Performed at [facility], [address] [place], [place] [address]  Ethanol     Status:  None   Collection Time: 07/23/18  2:30 PM  Result Value Ref Range   Alcohol, Ethyl (B) <10 <10 mg/dL    Comment: (NOTE) Lowest detectable limit for serum alcohol is 10 mg/dL. For medical purposes only. Performed at [facility], [address] [place], [place] [address]   Protime-INR     Status: Abnormal   Collection Time: 07/23/18  2:30 PM  Result Value Ref Range   Prothrombin Time 21.5 (H) 11.4 - 15.2 seconds   INR 1.89     Comment: Performed at [facility], [address] [place], [place] [address]  Urinalysis, Routine w reflex microscopic     Status: Abnormal   Collection Time: 07/23/18  3:06 PM  Result Value Ref Range   Color, Urine AMBER (A) YELLOW    Comment: BIOCHEMICALS MAY BE AFFECTED BY COLOR   APPearance CLEAR CLEAR   Specific Gravity, Urine 1.018 1.005 - 1.030   pH 7.0 5.0 - 8.0   Glucose, UA NEGATIVE NEGATIVE mg/dL   Hgb urine dipstick NEGATIVE NEGATIVE   Bilirubin Urine NEGATIVE NEGATIVE   Ketones, ur NEGATIVE NEGATIVE mg/dL   Protein, ur NEGATIVE NEGATIVE mg/dL   Nitrite NEGATIVE NEGATIVE   Leukocytes, UA NEGATIVE NEGATIVE    Comment: Performed at [facility], [address] [place], [place] [address]  CBC     Status: Abnormal   Collection Time: 07/23/18  7:45 PM  Result Value Ref Range   WBC 20.4 (H) 4.0 - 10.5 K/uL   RBC 3.16 (L) 4.22 - 5.81 MIL/uL   Hemoglobin 11.3 (L) 13.0 - 17.0 g/dL   HCT 56.233.7 (L) 13.039.0 - 86.552.0 %   MCV 106.6 (H) 80.0 - 100.0 fL   MCH 35.8 (H) 26.0 - 34.0 pg   MCHC 33.5 30.0 - 36.0 g/dL   RDW 78.414.5 69.611.5 - 29.515.5 %   Platelets 120 (L) 150 - 400 K/uL   nRBC 0.0 0.0 - 0.2 %    Comment: Performed at [facility], [address] [place], [place] [address]  Ammonia     Status: Abnormal   Collection Time: 07/23/18  8:49 PM   Result Value Ref Range   Ammonia 39 (H) 9 - 35 umol/L    Comment: Performed at [facility], [address] [place], [place] [address]  Lactic acid, plasma     Status: Abnormal   Collection Time: 07/23/18  8:49 PM  Result Value Ref Range   Lactic Acid, Venous 2.6 (HH) 0.5 - 1.9 mmol/L    Comment: CRITICAL RESULT CALLED TO, READ BACK BY AND VERIFIED WITH: SANGELANG B,RN 07/23/18 2150 WAYK Performed at [facility], [address] [place], [place] [address]   Comprehensive metabolic panel     Status: Abnormal   Collection Time: 07/24/18  2:45 AM  Result Value Ref Range   Sodium 128 (L) 135 - 145 mmol/L   Potassium 5.4 (H) 3.5 - 5.1 mmol/L   Chloride 94 (L) 98 - 111 mmol/L   CO2 23 22 - 32 mmol/L   Glucose, Bld 115 (H) 70 - 99 mg/dL   BUN 7 6 - 20 mg/dL   Creatinine, Ser 3.660.74 0.61 - 1.24 mg/dL   Calcium 9.0 8.9 - 44.010.3 mg/dL   Total Protein 8.0 6.5 - 8.1 g/dL   Albumin 2.2 (L) 3.5 - 5.0 g/dL   AST 347114 (H) 15 - 41 U/L   ALT 69 (H) 0 - 44  U/L   Alkaline Phosphatase 60 38 - 126 U/L   Total Bilirubin 6.8 (H) 0.3 - 1.2 mg/dL   GFR calc non Af Amer >60 >60 mL/min   GFR calc Af Amer >60 >60 mL/min   Anion gap 11 5 - 15    Comment: Performed at [facility], [address], [place], [place] [address]  CBC     Status: Abnormal   Collection Time: 07/24/18  2:45 AM  Result Value Ref Range   WBC 19.1 (H) 4.0 - 10.5 K/uL   RBC 2.99 (L) 4.22 - 5.81 MIL/uL   Hemoglobin 11.0 (L) 13.0 - 17.0 g/dL   HCT 60.4 (L) 54.0 - 98.1 %   MCV 106.7 (H) 80.0 - 100.0 fL   MCH 36.8 (H) 26.0 - 34.0 pg   MCHC 34.5 30.0 - 36.0 g/dL   RDW 19.1 47.8 - 29.5 %   Platelets 103 (L) 150 - 400 K/uL    Comment: REPEATED TO VERIFY PLATELET COUNT CONFIRMED BY SMEAR SPECIMEN CHECKED FOR CLOTS Immature Platelet Fraction may be clinically indicated, consider ordering this additional test [id number]    nRBC 0.0 0.0 - 0.2 %    Comment: Performed at [facility], [address]  [place], [place] [address]    Ct Abdomen Pelvis W Contrast  Result Date: 07/23/2018 CLINICAL DATA:  Abdominal cramping with nausea and vomiting. Leukocytosis with elevated lipase. EXAM: CT ABDOMEN AND PELVIS WITH CONTRAST TECHNIQUE: Multidetector CT imaging of the abdomen and pelvis was performed using the standard protocol following bolus administration of intravenous contrast. CONTRAST:  OMNIPAQUE IOHEXOL 300 MG/ML  SOLN COMPARISON:  06/17/2018 FINDINGS: Lower chest: Coronary artery calcification is evident. Hepatobiliary: No focal abnormality within the liver parenchyma. Gallbladder is distended with dependent sludge. Extrahepatic common duct mildly distended at 7 mm diameter. Pancreas: Pancreatic parenchyma enhances throughout. No appreciable peripancreatic edema or inflammation. No dilatation of the main pancreatic duct. Spleen: No focal mass lesion. No dilatation of the main duct. No intraparenchymal cyst. No splenomegaly. No focal mass lesion. Adrenals/Urinary Tract: No adrenal nodule or mass. Right kidney unremarkable. 2 mm nonobstructing interpolar left renal stone evident. No evidence for hydroureter. The urinary bladder appears normal for the degree of distention. Stomach/Bowel: Stomach is nondistended. No gastric wall thickening. No evidence of outlet obstruction. Duodenum is normally positioned as is the ligament of Treitz. No small bowel wall thickening. No small bowel dilatation. The terminal ileum is normal. The appendix is normal. No gross colonic mass. No colonic wall thickening. Vascular/Lymphatic: There is abdominal aortic atherosclerosis without aneurysm. There is no gastrohepatic or hepatoduodenal ligament lymphadenopathy. No intraperitoneal or retroperitoneal lymphadenopathy. No pelvic sidewall lymphadenopathy. Reproductive: The prostate gland and seminal vesicles have normal imaging features. Other: Trace perihepatic fluid fluid. There is some thickening of the peritoneum in the  posterior right abdomen and to a lesser degree in the posterior left abdomen. This was present to a more prominent extent on the previous exam. No pelvic sidewall lymphadenopathy. Musculoskeletal: No worrisome lytic or sclerotic osseous abnormality. 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 IMPRESSION: 1. No CT signs of acute pancreatitis. 2. Trace perihepatic fluid. Gallbladder is distended with dependent sludge and upper normal to mild common bile duct distention. 3. Thickening of the peritoneum in the posterior abdomen bilaterally was present previously and suggest chronic. 4. 2 mm nonobstructing left renal stone. 5.  Aortic Atherosclerois (ICD10-170.0) Electronically Signed   By: Kennith Center M.D.   On: 07/23/2018 16:43  US Abdomen Limited Ruq  Result Date: 07/23/2018 CLINICAL DATA:  RIGHT upper quadrant pain. History of alcoholism and kidney stone. EXAM: ULTRASOUND ABDOMEN LIMITED RIGHT UPPER QUADRANT COMPARISON:  CT abdomen and pelvis July 23, 2018 FINDINGS: Gallbladder: Echogenic sludge ball at the gallbladder neck without discrete cholelithiasis. Mild gallbladder distension without gallbladder wall thickening. Trace pericholecystic fluid. Sonographic Murphy sign not elicited though patient was reportedly on pain medication. Common bile duct: Diameter: 8 mm Liver: No focal lesion identified. Within normal limits in parenchymal echogenicity. Portal vein is patent on color Doppler imaging with normal direction of blood flow towards the liver. IMPRESSION: 1. Gallbladder sludge and trace pericholecystic fluid seen with acute cholecystitis, however limited assessment for sonographic Murphy sign as patient was reportedly on pain medication. 2. Mildly enlarged common bile duct, this could be related to recent passage of sludge/stones. Electronically Signed   By: Awilda Metro M.D.   On: 07/23/2018 18:41               Blood pressure 106/68, pulse (!) 106, temperature 98 F  (36.7 C), temperature source Oral, resp. rate 18, height 5\' 10"  (1.778 m), weight 74.8 kg, SpO2 98 %.  Physical exam:   General--alert on computer playing game ENT--slight icteric  Heart--RRR no M/G Lungs--clear Abdomen--mild EG tenderness good BSs    Assessment: 1. Pancreatitis 2. ALD 3. GSs currently treated for cholecystitis with AB Surgery has seen  Plan: Would get MRCP. Dr Ewing Schlein will see tomorrow   Tresea Mall 07/24/2018, 11:16 AM   This note was created using voice recognition software and minor errors may Have occurred unintentionally. Pager: [phone] If no answer or after hours call [phone]

## 2018-07-24 NOTE — Progress Notes (Addendum)
Had very lengthy discussion with the Patient about bring on falls precautions, and why he was on falls precautions, and bed alarm being on.  After discussing again with the Patient and Gearldine BienenstockBrandy, his night nurse, he understood and agreed to have it on for the night, and understood why he was on Fall precautions.   Also, he gave me permission to talk to his daughter Vernona RiegerLaura, about the Fall precaution and other questions that she had

## 2018-07-24 NOTE — ED Notes (Signed)
Pt speaking with admitting physician.

## 2018-07-24 NOTE — Discharge Summary (Signed)
Family Medicine Teaching [facility] Discharge Summary  Patient name: Alfred Kelley Medical record number: [id number] Date of birth: July 20, 1956 Age: 62 y.o. Gender: male Date of Admission: 07/23/2018  Date of Discharge: 07/25/2018 Admitting Physician: Latrelle DodrillBrittany J McIntyre, MD  Primary Care Provider: Patient, No Pcp Per Consultants: GI  Indication for Hospitalization: abdominal pain, vomiting  Discharge Diagnoses/Problem List:  Pancreatitis Alcohol use disorder Cirrhosis HTN Macrocytic anemia thrombocytopenia   Disposition: home  Discharge Condition: patient left AMA  Discharge Exam:  BP (!) 144/74 (BP Location: Left Arm)   Pulse (!) 115   Temp 98.5 F (36.9 C) (Oral)   Resp 20   Ht 5\' 9"  (1.753 m)   Wt 74.5 kg   SpO2 97%   BMI 24.25 kg/m  Gen: NAD, alert, oriented to person, place, and time.    HEENT:   Mild scleral icterus seen biaterally.   CV: Regular rhythm. Tachycardic. Normal capillary refill bilaterally.  Radial pulses 2+ bilaterally. No bilateral lower extremity edema. Resp: Clear to auscultation bilaterally. No wheezing, rales, abnormal lung sounds.  No increased work of breathing appreciated. Abd: nontender to palpation.  Normal bowel sounds Neuro: patient is tremulous in fingers, but no flapping tremor.  No FND.  Psych: patient is very anxious.  He is insistent on leaving despite being told of the dangers of leaving.   Brief Hospital Course:  Alfred Kelley is a 62 y.o. male presenting with "burning in my belly". 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   In the ED patient was found to have an elevated lipase and CT scan suggestive of cholecystitis and pancreatitis.  Surgery and GI were consulted.  Surgery did not think patient was good surgical candidate.  Patient was started on antibiotics.  MRCP was performed and indicated most likely a  pancreatitis. The next morning the patient had taken out his IV and was insisting on leaving the hospital.  Despite numerous attempts to get him to stay from staff and his family, the patient left against medical advice.    Issues for Follow Up:  1. Alcohol abuse - patient states he has been sober for 30 days but his actions before leaving AMA suggested he may have been withdrawing from alcohol use.  Counsel patient on alcohol cessation and offer assistance with quitting. 2. Pancreatitis - unsure of etiology, stone vs/ alcohol, but patient is likely to have another episode in the future given his risk factors.   3. Gallstones - patient will need to have gallbladder removed at some point in the future  Significant Procedures: MRCP  Significant Labs and Imaging:  Recent Labs  Lab 07/23/18 1945 07/24/18 0245 07/25/18 0150  WBC 20.4* 19.1* 10.3  HGB 11.3* 11.0* 10.7*  HCT 33.7* 31.9* 30.4*  PLT 120* 103* 98*   Recent Labs  Lab 07/23/18 1430 07/24/18 0245 07/24/18 1545 07/25/18 0150  NA 125* 128* 126* 127*  K 4.8 5.4* 4.3 4.8  CL 89* 94* 94* 91*  CO2 24 23 26 25   GLUCOSE 174* 115* 105* 87  BUN 10 7 9 12   CREATININE 0.99 0.74 0.84 0.99  CALCIUM 9.5 9.0 8.7* 8.3*  ALKPHOS 79 60  --   --   AST 114* 114*  --   --   ALT 63* 69*  --   --   ALBUMIN 2.6* 2.2*  --   --      Results/Tests Pending at Time of Discharge: none  Discharge Medications:  Allergies as of 07/25/2018   No Known Allergies     Medication List    ASK your doctor about these medications   alum & mag hydroxide-simeth 200-200-20 MG/5ML suspension Commonly known as:  MAALOX/MYLANTA Take 20 mLs by mouth every 6 (six) hours as needed for indigestion or heartburn.   divalproex 125 MG DR tablet Commonly known as:  DEPAKOTE Take 125 mg by mouth 2 (two) times daily.   feeding supplement (ENSURE ENLIVE) Liqd Take 237 mLs by mouth 2 (two) times daily between meals.   folic acid 1 MG tablet Commonly known as:   FOLVITE Take 1 tablet (1 mg total) by mouth daily.   lactulose 10 GM/15ML solution Commonly known as:  CHRONULAC Take 30 mLs (20 g total) by mouth 2 (two) times daily.   MELATONIN PO Take 6 mg by mouth at bedtime.   multivitamin with minerals Tabs tablet Take 1 tablet by mouth daily.   rifaximin 550 MG Tabs tablet Commonly known as:  XIFAXAN Take 1 tablet (550 mg total) by mouth 2 (two) times daily.   spironolactone 100 MG tablet Commonly known as:  ALDACTONE Take 1 tablet (100 mg total) by mouth 2 (two) times daily.   thiamine 100 MG tablet Take 1 tablet (100 mg total) by mouth daily.   torsemide 20 MG tablet Commonly known as:  DEMADEX Take 1 tablet (20 mg total) by mouth 2 (two) times daily.       Discharge Instructions: Please refer to Patient Instructions section of EMR for full details.  Patient was counseled important signs and symptoms that should prompt return to medical care, changes in medications, dietary instructions, activity restrictions, and follow up appointments.   Follow-Up Appointments:   Sandre Kitty, MD 07/27/2018, 9:40 PM PGY-1, [facility]

## 2018-07-25 LAB — CBC WITH DIFFERENTIAL/PLATELET
ABS IMMATURE GRANULOCYTES: 0.1 10*3/uL — AB (ref 0.00–0.07)
Basophils Absolute: 0.1 10*3/uL (ref 0.0–0.1)
Basophils Relative: 1 %
Eosinophils Absolute: 0.4 10*3/uL (ref 0.0–0.5)
Eosinophils Relative: 4 %
HCT: 30.4 % — ABNORMAL LOW (ref 39.0–52.0)
Hemoglobin: 10.7 g/dL — ABNORMAL LOW (ref 13.0–17.0)
Immature Granulocytes: 1 %
LYMPHS PCT: 21 %
Lymphs Abs: 2.1 10*3/uL (ref 0.7–4.0)
MCH: 36.8 pg — ABNORMAL HIGH (ref 26.0–34.0)
MCHC: 35.2 g/dL (ref 30.0–36.0)
MCV: 104.5 fL — ABNORMAL HIGH (ref 80.0–100.0)
Monocytes Absolute: 1.2 10*3/uL — ABNORMAL HIGH (ref 0.1–1.0)
Monocytes Relative: 12 %
NEUTROS PCT: 61 %
Neutro Abs: 6.3 10*3/uL (ref 1.7–7.7)
Platelets: 98 10*3/uL — ABNORMAL LOW (ref 150–400)
RBC: 2.91 MIL/uL — AB (ref 4.22–5.81)
RDW: 14.8 % (ref 11.5–15.5)
WBC: 10.3 10*3/uL (ref 4.0–10.5)
nRBC: 0 % (ref 0.0–0.2)

## 2018-07-25 LAB — BASIC METABOLIC PANEL
Anion gap: 11 (ref 5–15)
BUN: 12 mg/dL (ref 6–20)
CHLORIDE: 91 mmol/L — AB (ref 98–111)
CO2: 25 mmol/L (ref 22–32)
Calcium: 8.3 mg/dL — ABNORMAL LOW (ref 8.9–10.3)
Creatinine, Ser: 0.99 mg/dL (ref 0.61–1.24)
GFR calc Af Amer: >60 mL/min (ref >60)
GFR calc non Af Amer: >60 mL/min (ref >60)
Glucose, Bld: 87 mg/dL (ref 70–99)
POTASSIUM: 4.8 mmol/L (ref 3.5–5.1)
Sodium: 127 mmol/L — ABNORMAL LOW (ref 135–145)

## 2018-07-25 LAB — LACTIC ACID, PLASMA: Lactic Acid, Venous: 1.2 mmol/L (ref 0.5–1.9)

## 2018-07-25 MED ORDER — LORAZEPAM 1 MG PO TABS
1.0000 mg | ORAL_TABLET | Freq: Four times a day (QID) | ORAL | Status: DC | PRN
  Filled 2018-07-25: qty 2

## 2018-07-25 MED ORDER — LORAZEPAM 2 MG/ML IJ SOLN: 1.0000 mg | Freq: Four times a day (QID) | INTRAMUSCULAR | Status: DC | PRN

## 2018-07-25 MED ORDER — FOLIC ACID 1 MG PO TABS: 1.0000 mg | ORAL_TABLET | Freq: Every day | ORAL | Status: DC

## 2018-07-25 MED ORDER — THIAMINE HCL 100 MG/ML IJ SOLN: 100.0000 mg | Freq: Every day | INTRAMUSCULAR | Status: DC

## 2018-07-25 MED ORDER — ADULT MULTIVITAMIN W/MINERALS CH: 1.0000 | ORAL_TABLET | Freq: Every day | ORAL | Status: DC

## 2018-07-25 MED ORDER — VITAMIN B-1 100 MG PO TABS: 100.0000 mg | ORAL_TABLET | Freq: Every day | ORAL | Status: DC

## 2018-07-25 NOTE — Progress Notes (Signed)
Family Medicine Teaching Service Daily Progress Note Intern Pager: [phone]  Patient name: Alfred Kelley Medical record number: [id number] Date of birth: 11/28/1955 Age: 62 y.o. Gender: male  Primary Care Provider: Patient, No Pcp Per Consultants: GI, surgery Code Status: Full code  Pt Overview and Major Events to Date:  Hospital Day 2 Admitted: 07/23/2018  Assessment and Plan: Alfred Kelley is a 62 y.o. male who presented with abdominal pain concerning for panc/cholecystitis. 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    cholecystitis/pancreatitis - this morning patient states his abdominal pain is much better.  On exam he is nontender to palpation.  RUQ showed sludge, enalrged CBD and trace cholecystic fluid.  Lipase was 263.  Wbc 19.1> 10.3. LA 2.6>2.2>1.2 No stones seen obstructing pancrase or gallbaldder. MRCP indicated more likely pancreatitis and less likely cholecystitis. Patient is afebrile.  Receiving antibiotics and awaiting  GI recs s/p MRCP. - continue CTX and flagyl - surgery consulted, no surgical intervention at this time.  - GI consulted, recommending MRCP - dilaudid 1mg  q3h for pain. - NPO/mIVF 14ml/hr.    Liver cirrhosis 2/2 previous alcoholism - preexisting. Patient has not had alcohol in 30 days he states. He was tremulous on exam today. At home takes torsemide 20mg  twice daily, spironolactone, 100mg  BID, lactulose, rifaxamine.  Follows with Dr. Matthias Hughs.  meld- Na score of 28.  INR 1.8, platelets 127, AST 114, ALT 69, ammonia 39.   - GI following.  - AM CMP -holding home meds while NPO   HTN: chronic stable - most recent BP's < 120. Holding meds for now.  - holding home meds while NPO  Hyponatremia - 127 this morning.Switching to NS from LR due to hyperkalemia - 125 ml/hr NS - AM CMP  Hyperkalemia  - 5.4 this am.  Switching from LR to NS mIVF.  - AM  CMP  Macrocytic anemia: Chronic, stable. Hemoglobin 12.2, MCV 104.  Baseline around 9.  Continue to monitor.  -Monitor CBC  Thrombocytopenia: Chronic, stable. Platelets 127, at baseline.  Also likely in the setting of previous chronic alcohol use.  -Monitor CBC    Fluids: 147ml/hr NS   . sodium chloride 125 mL/hr at 07/24/18 1607  . cefTRIAXone (ROCEPHIN)  IV 2 g (07/24/18 1751)  . metronidazole 500 mg (07/24/18 2328)   Electrolytes: replete PRN  Nutrition: NPO GI ppx: none DVT px: lovenox 40mg  daily  Disposition: uncertain. Likely home   Medications: Scheduled Meds: . enoxaparin (LOVENOX) injection  40 mg Subcutaneous Daily  . feeding supplement (ENSURE ENLIVE)  237 mL Oral BID BM  . folic acid  1 mg Oral Daily  . lactulose  20 g Oral BID  . multivitamin with minerals  1 tablet Oral Daily  . rifaximin  550 mg Oral BID  . senna  1 tablet Oral Daily  . spironolactone  100 mg Oral BID  . thiamine  100 mg Oral Daily   Or  . thiamine  100 mg Intravenous Daily  . torsemide  20 mg Oral BID   Continuous Infusions: . sodium chloride 125 mL/hr at 07/24/18 1607  . cefTRIAXone (ROCEPHIN)  IV 2 g (07/24/18 1751)  . metronidazole 500 mg (07/24/18 2328)   PRN Meds: acetaminophen **OR** acetaminophen, alum & mag hydroxide-simeth, calcium carbonate, HYDROmorphone (DILAUDID) injection, ondansetron **OR** ondansetron (ZOFRAN) IV  ================================================= ================================================= Subjective:  Patient states he would like to leave and go to [facility] to get treated.  His IV was out and he states he does not know how it got out.  He states people were giving him medications without his permission and that his brother and father are coming to pick him up.  Patient did not remember talking to me the previous day.  He initially did not know he was here for pancreatitis.     Objective: Temp:  [98.2 F (36.8 C)-99.2 F (37.3 C)] 98.5 F (36.9  C) (12/09 0551) Pulse Rate:  [103-115] 115 (12/09 0551) Resp:  [16-20] 20 (12/09 0551) BP: (106-144)/(60-74) 144/74 (12/09 0551) SpO2:  [96 %-99 %] 97 % (12/09 0551) Weight:  [74.5 kg] 74.5 kg (12/08 1506) Intake/Output 12/08 0701 - 12/09 0700 In: 2170.6 [P.O.:880; I.V.:1100; IV Piggyback:190.6] Out: 1100 [Urine:1100] Physical Exam:  Gen: NAD, alert, oriented to person, place, and time.    HEENT:   Mild scleral icterus seen biaterally.   CV: Regular rhythm. Tachycardic. Normal capillary refill bilaterally.  Radial pulses 2+ bilaterally. No bilateral lower extremity edema. Resp: Clear to auscultation bilaterally. No wheezing, rales, abnormal lung sounds.  No increased work of breathing appreciated. Abd: nontender to palpation.  Normal bowel sounds Neuro: patient is tremulous in fingers, but no flapping tremor.  No FND.  Psych: patient is very anxious.  He is insistent on leaving despite being told of the dangers of leaving.      Laboratory: Recent Labs  Lab 07/23/18 1945 07/24/18 0245 07/25/18 0150  WBC 20.4* 19.1* 10.3  HGB 11.3* 11.0* 10.7*  HCT 33.7* 31.9* 30.4*  PLT 120* 103* 98*   Recent Labs  Lab 07/23/18 1430 07/24/18 0245 07/24/18 1545 07/25/18 0150  NA 125* 128* 126* 127*  K 4.8 5.4* 4.3 4.8  CL 89* 94* 94* 91*  CO2 24 23 26 25   BUN 10 7 9 12   CREATININE 0.99 0.74 0.84 0.99  CALCIUM 9.5 9.0 8.7* 8.3*  PROT 8.8* 8.0  --   --   BILITOT 5.7* 6.8*  --   --   ALKPHOS 79 60  --   --   ALT 63* 69*  --   --   AST 114* 114*  --   --   GLUCOSE 174* 115* 105* 87    Imaging/Diagnostic Tests: Ct Abdomen Pelvis W Contrast  Result Date: 07/23/2018 CLINICAL DATA:  Abdominal cramping with nausea and vomiting. Leukocytosis with elevated lipase. EXAM: CT ABDOMEN AND PELVIS WITH CONTRAST TECHNIQUE: Multidetector CT imaging of the abdomen and pelvis was performed using the standard protocol following bolus administration of intravenous contrast. CONTRAST:  OMNIPAQUE  IOHEXOL 300 MG/ML  SOLN COMPARISON:  06/17/2018 FINDINGS: Lower chest: Coronary artery calcification is evident. Hepatobiliary: No focal abnormality within the liver parenchyma. Gallbladder is distended with dependent sludge. Extrahepatic common duct mildly distended at 7 mm diameter. Pancreas: Pancreatic parenchyma enhances throughout. No appreciable peripancreatic edema or inflammation. No dilatation of the main pancreatic duct. Spleen: No focal mass lesion. No dilatation of the main duct. No intraparenchymal cyst. No splenomegaly. No focal mass lesion. Adrenals/Urinary Tract: No adrenal nodule or mass. Right kidney unremarkable. 2 mm nonobstructing interpolar left renal stone evident. No evidence for hydroureter. The urinary bladder appears normal for the degree of distention. Stomach/Bowel: Stomach is nondistended. No gastric wall thickening. No evidence of outlet obstruction. Duodenum is normally positioned as is the ligament of Treitz. No small bowel wall thickening. No small bowel dilatation. The terminal ileum is normal. The appendix is normal. No gross colonic mass. No colonic wall thickening. Vascular/Lymphatic:  There is abdominal aortic atherosclerosis without aneurysm. There is no gastrohepatic or hepatoduodenal ligament lymphadenopathy. No intraperitoneal or retroperitoneal lymphadenopathy. No pelvic sidewall lymphadenopathy. Reproductive: The prostate gland and seminal vesicles have normal imaging features. Other: Trace perihepatic fluid fluid. There is some thickening of the peritoneum in the posterior right abdomen and to a lesser degree in the posterior left abdomen. This was present to a more prominent extent on the previous exam. No pelvic sidewall lymphadenopathy. Musculoskeletal: No worrisome lytic or sclerotic osseous abnormality. 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 IMPRESSION: 1. No CT signs of acute pancreatitis. 2. Trace perihepatic fluid. Gallbladder is distended with  dependent sludge and upper normal to mild common bile duct distention. 3. Thickening of the peritoneum in the posterior abdomen bilaterally was present previously and suggest chronic. 4. 2 mm nonobstructing left renal stone. 5.  Aortic Atherosclerois (ICD10-170.0) Electronically Signed   By: Kennith Center M.D.   On: 07/23/2018 16:43   Mr Abdomen Mrcp Vivien Rossetti Contast  Result Date: 07/24/2018 CLINICAL DATA:  Acute pancreatitis EXAM: MRI ABDOMEN WITHOUT AND WITH CONTRAST (INCLUDING MRCP) TECHNIQUE: Multiplanar multisequence MR imaging of the abdomen was performed both before and after the administration of intravenous contrast. Heavily T2-weighted images of the biliary and pancreatic ducts were obtained, and three-dimensional MRCP images were rendered by post processing. CONTRAST:  5 mL Gadovist IV COMPARISON:  CT abdomen/pelvis dated 07/23/2018. Right upper quadrant ultrasound dated 07/23/2018. FINDINGS: Motion degraded images. Lower chest: Lung bases are clear. Hepatobiliary: Liver is within normal limits. No hepatic steatosis. No focal hepatic lesions. Layering gallstones and/or gallbladder sludge. Mild gallbladder wall thickening without convincing inflammatory changes. No intrahepatic or extrahepatic ductal dilatation. Common duct measures 8 mm. No choledocholithiasis is seen. Pancreas: Mild peripancreatic fluid/inflammatory changes along the pancreatic tail. No pancreatic atrophy or ductal dilatation. No drainable fluid collection/pseudocyst. No evidence of pancreatic divisum. Spleen:  Within normal limits. Adrenals/Urinary Tract:  Adrenal glands are within normal limits. Kidneys are within normal limits.  No hydronephrosis. Stomach/Bowel: Stomach is within normal limits. Visualized bowel is grossly unremarkable Vascular/Lymphatic:  No evidence of abdominal aortic aneurysm. No suspicious abdominal lymphadenopathy. Other:  No abdominal ascites. Musculoskeletal: No focal osseous lesions. IMPRESSION: Motion  degraded images. Mild peripancreatic fluid/inflammatory changes, compatible with known acute pancreatitis. No drainable fluid collection/pseudocyst. No evidence of pancreatic divisum. Cholelithiasis with mild gallbladder wall thickening. No convincing findings to suggest acute cholecystitis. No intrahepatic or extrahepatic ductal dilatation. Common duct measures 8 mm. No choledocholithiasis is seen. Electronically Signed   By: Charline Bills M.D.   On: 07/24/2018 14:23   US Abdomen Limited Ruq  Result Date: 07/23/2018 CLINICAL DATA:  RIGHT upper quadrant pain. History of alcoholism and kidney stone. EXAM: ULTRASOUND ABDOMEN LIMITED RIGHT UPPER QUADRANT COMPARISON:  CT abdomen and pelvis July 23, 2018 FINDINGS: Gallbladder: Echogenic sludge ball at the gallbladder neck without discrete cholelithiasis. Mild gallbladder distension without gallbladder wall thickening. Trace pericholecystic fluid. Sonographic Murphy sign not elicited though patient was reportedly on pain medication. Common bile duct: Diameter: 8 mm Liver: No focal lesion identified. Within normal limits in parenchymal echogenicity. Portal vein is patent on color Doppler imaging with normal direction of blood flow towards the liver. IMPRESSION: 1. Gallbladder sludge and trace pericholecystic fluid seen with acute cholecystitis, however limited assessment for sonographic Murphy sign as patient was reportedly on pain medication. 2. Mildly enlarged common bile duct, this could be related to recent passage of sludge/stones. Electronically Signed   By: Michel Santee.D.  On: 07/23/2018 18:41      Sandre Kittylson, Charletha Dalpe K, MD 07/25/2018, 6:20 AM PGY-1, [facility] FPTS Intern pager: [phone], text pages welcome

## 2018-07-25 NOTE — Progress Notes (Signed)
Discussed with patient fall risk and bed alarm and Pt refuses bed alarm

## 2018-07-25 NOTE — Progress Notes (Signed)
Alfred Kelley 10:29 AM  Subjective: Patient seen and examined and case discussed with multiple family members as well as the hospital team and his hospital computer chart reviewed and case discussed with my partner Dr. Randa EvensEdwards and the patient currently is asymptomatic and wants to go home but has not eaten breakfast which we discussed and supposedly he has not drank in in 6 weeks and has no other complaints  Objective: Vital signs stable afebrile no acute distress abdomen is soft nontender labs reviewed white count improved CT and MRI reviewed  Assessment: Pancreatitis questionable alcohol versus gallbladder  Plan: Patient will probably need his gallbladder out at some point however from my standpoint if he is able to eat he can go home and either my partners Dr. Matthias HughsBuccini or Levora AngelBrahmbhatt are happy to see back in the near future and please call me or Dr. Levora AngelBrahmbhatt if we could be of any further assistance with this hospital stay  [facility],Niyah Mamaril E  Pager [phone] After 5PM or if no answer call [phone]

## 2018-07-25 NOTE — Progress Notes (Signed)
Pt has increase confusion. He has been picking at his IV, and then his IV was alarming and noted his PIV was dislodged, blood all over the Pt and the bedding.  He then became agitated and anxious, stating that his arm hurt, and the bruising on his arm (which was there on admission) was raised - was hurting and he needed to go to the Emergency room, and wanted to leave the facility,. I explained to him he was in the hospital and we could take care of him here in the hospital.  He still wanted go to the ED for treatment and not here. I paged the MD to advised of the situation.  Per MD resident will be around to assess the Pt.

## 2018-07-25 NOTE — Progress Notes (Signed)
FPTS Interim Progress Note  S: Patient was wanting to leave the hospital. He states he is no longer in pain and he did not understand why he had an IV in his arm. I attempted to explain to the patient the IV was inserted so he could receive medications. He still expressed a desire to go home. I spoke with him extensively about the risk and danger of going home with pancreatitis before he had made a full recovery. I recommended he stay and discussed that his leaving against medical advice puts him at risk for his condition worsening, developing complications, and/or death. He expressed understanding and said he still wanted to leave.  O:  Patient was able to express understanding of his medical condition and state using teach back, the ability to make decision, appreciation of his condition and the risk he was in, and reasoning of his choice and how that may impact his health.  BP (!) 144/74 (BP Location: Left Arm)   Pulse (!) 115   Temp 98.5 F (36.9 C) (Oral)   Resp 20   Ht 5\' 9"  (1.753 m)   Wt 74.5 kg   SpO2 97%   BMI 24.25 kg/m     A/P: Patient left against medical advice.  Arlyce HarmanLockamy, Yalda Herd, DO 07/25/2018, 5:29 PM PGY-2, [facility] pager [phone]

## 2018-07-25 NOTE — Progress Notes (Signed)
[facility] Progress Note     Subjective: CC: no pain Patient reports no pain or nausea, passing flatus. Signing out AMA.   Objective: Vital signs in last 24 hours: Temp:  [98.5 F (36.9 C)-99.2 F (37.3 C)] 98.5 F (36.9 C) (12/09 0551) Pulse Rate:  [103-115] 115 (12/09 0551) Resp:  [16-20] 20 (12/09 0551) BP: (109-144)/(60-74) 144/74 (12/09 0551) SpO2:  [96 %-99 %] 97 % (12/09 0551) Weight:  [74.5 kg] 74.5 kg (12/08 1506) Last BM Date: 07/24/18  Intake/Output from previous day: 12/08 0701 - 12/09 0700 In: 2170.6 [P.O.:880; I.V.:1100; IV Piggyback:190.6] Out: 1100 [Urine:1100] Intake/Output this shift: No intake/output data recorded.  PE: Gen:  Alert, NAD, pleasant Card:  Regular rate and rhythm Pulm:  Normal effort Abd: Soft, non-tender, non-distended, bowel sounds present Skin: warm and dry, no rashes    Lab Results:  Recent Labs    07/24/18 0245 07/25/18 0150  WBC 19.1* 10.3  HGB 11.0* 10.7*  HCT 31.9* 30.4*  PLT 103* 98*   BMET Recent Labs    07/24/18 1545 07/25/18 0150  NA 126* 127*  K 4.3 4.8  CL 94* 91*  CO2 26 25  GLUCOSE 105* 87  BUN 9 12  CREATININE 0.84 0.99  CALCIUM 8.7* 8.3*   PT/INR Recent Labs    07/23/18 1430  LABPROT 21.5*  INR 1.89   CMP     Component Value Date/Time   NA 127 (L) 07/25/2018 0150   K 4.8 07/25/2018 0150   CL 91 (L) 07/25/2018 0150   CO2 25 07/25/2018 0150   GLUCOSE 87 07/25/2018 0150   BUN 12 07/25/2018 0150   CREATININE 0.99 07/25/2018 0150   CALCIUM 8.3 (L) 07/25/2018 0150   PROT 8.0 07/24/2018 0245   ALBUMIN 2.2 (L) 07/24/2018 0245   AST 114 (H) 07/24/2018 0245   ALT 69 (H) 07/24/2018 0245   ALKPHOS 60 07/24/2018 0245   BILITOT 6.8 (H) 07/24/2018 0245   GFRNONAA >60 07/25/2018 0150   GFRAA >60 07/25/2018 0150   Lipase     Component Value Date/Time   LIPASE 263 (H) 07/23/2018 1430       Studies/Results: Ct Abdomen Pelvis W Contrast  Result Date: 07/23/2018 CLINICAL  DATA:  Abdominal cramping with nausea and vomiting. Leukocytosis with elevated lipase. EXAM: CT ABDOMEN AND PELVIS WITH CONTRAST TECHNIQUE: Multidetector CT imaging of the abdomen and pelvis was performed using the standard protocol following bolus administration of intravenous contrast. CONTRAST:  OMNIPAQUE IOHEXOL 300 MG/ML  SOLN COMPARISON:  06/17/2018 FINDINGS: Lower chest: Coronary artery calcification is evident. Hepatobiliary: No focal abnormality within the liver parenchyma. Gallbladder is distended with dependent sludge. Extrahepatic common duct mildly distended at 7 mm diameter. Pancreas: Pancreatic parenchyma enhances throughout. No appreciable peripancreatic edema or inflammation. No dilatation of the main pancreatic duct. Spleen: No focal mass lesion. No dilatation of the main duct. No intraparenchymal cyst. No splenomegaly. No focal mass lesion. Adrenals/Urinary Tract: No adrenal nodule or mass. Right kidney unremarkable. 2 mm nonobstructing interpolar left renal stone evident. No evidence for hydroureter. The urinary bladder appears normal for the degree of distention. Stomach/Bowel: Stomach is nondistended. No gastric wall thickening. No evidence of outlet obstruction. Duodenum is normally positioned as is the ligament of Treitz. No small bowel wall thickening. No small bowel dilatation. The terminal ileum is normal. The appendix is normal. No gross colonic mass. No colonic wall thickening. Vascular/Lymphatic: There is abdominal aortic atherosclerosis without aneurysm. There is no gastrohepatic or hepatoduodenal ligament lymphadenopathy.  No intraperitoneal or retroperitoneal lymphadenopathy. No pelvic sidewall lymphadenopathy. Reproductive: The prostate gland and seminal vesicles have normal imaging features. Other: Trace perihepatic fluid fluid. There is some thickening of the peritoneum in the posterior right abdomen and to a lesser degree in the posterior left abdomen. This was present to a  more prominent extent on the previous exam. No pelvic sidewall lymphadenopathy. Musculoskeletal: No worrisome lytic or sclerotic osseous abnormality. 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 IMPRESSION: 1. No CT signs of acute pancreatitis. 2. Trace perihepatic fluid. Gallbladder is distended with dependent sludge and upper normal to mild common bile duct distention. 3. Thickening of the peritoneum in the posterior abdomen bilaterally was present previously and suggest chronic. 4. 2 mm nonobstructing left renal stone. 5.  Aortic Atherosclerois (ICD10-170.0) Electronically Signed   By: Kennith Center M.D.   On: 07/23/2018 16:43   Mr Abdomen Mrcp Vivien Rossetti Contast  Result Date: 07/24/2018 CLINICAL DATA:  Acute pancreatitis EXAM: MRI ABDOMEN WITHOUT AND WITH CONTRAST (INCLUDING MRCP) TECHNIQUE: Multiplanar multisequence MR imaging of the abdomen was performed both before and after the administration of intravenous contrast. Heavily T2-weighted images of the biliary and pancreatic ducts were obtained, and three-dimensional MRCP images were rendered by post processing. CONTRAST:  5 mL Gadovist IV COMPARISON:  CT abdomen/pelvis dated 07/23/2018. Right upper quadrant ultrasound dated 07/23/2018. FINDINGS: Motion degraded images. Lower chest: Lung bases are clear. Hepatobiliary: Liver is within normal limits. No hepatic steatosis. No focal hepatic lesions. Layering gallstones and/or gallbladder sludge. Mild gallbladder wall thickening without convincing inflammatory changes. No intrahepatic or extrahepatic ductal dilatation. Common duct measures 8 mm. No choledocholithiasis is seen. Pancreas: Mild peripancreatic fluid/inflammatory changes along the pancreatic tail. No pancreatic atrophy or ductal dilatation. No drainable fluid collection/pseudocyst. No evidence of pancreatic divisum. Spleen:  Within normal limits. Adrenals/Urinary Tract:  Adrenal glands are within normal limits. Kidneys are within normal  limits.  No hydronephrosis. Stomach/Bowel: Stomach is within normal limits. Visualized bowel is grossly unremarkable Vascular/Lymphatic:  No evidence of abdominal aortic aneurysm. No suspicious abdominal lymphadenopathy. Other:  No abdominal ascites. Musculoskeletal: No focal osseous lesions. IMPRESSION: Motion degraded images. Mild peripancreatic fluid/inflammatory changes, compatible with known acute pancreatitis. No drainable fluid collection/pseudocyst. No evidence of pancreatic divisum. Cholelithiasis with mild gallbladder wall thickening. No convincing findings to suggest acute cholecystitis. No intrahepatic or extrahepatic ductal dilatation. Common duct measures 8 mm. No choledocholithiasis is seen. Electronically Signed   By: Charline Bills M.D.   On: 07/24/2018 14:23   US Abdomen Limited Ruq  Result Date: 07/23/2018 CLINICAL DATA:  RIGHT upper quadrant pain. History of alcoholism and kidney stone. EXAM: ULTRASOUND ABDOMEN LIMITED RIGHT UPPER QUADRANT COMPARISON:  CT abdomen and pelvis July 23, 2018 FINDINGS: Gallbladder: Echogenic sludge ball at the gallbladder neck without discrete cholelithiasis. Mild gallbladder distension without gallbladder wall thickening. Trace pericholecystic fluid. Sonographic Murphy sign not elicited though patient was reportedly on pain medication. Common bile duct: Diameter: 8 mm Liver: No focal lesion identified. Within normal limits in parenchymal echogenicity. Portal vein is patent on color Doppler imaging with normal direction of blood flow towards the liver. IMPRESSION: 1. Gallbladder sludge and trace pericholecystic fluid seen with acute cholecystitis, however limited assessment for sonographic Murphy sign as patient was reportedly on pain medication. 2. Mildly enlarged common bile duct, this could be related to recent passage of sludge/stones. Electronically Signed   By: Awilda Metro M.D.   On: 07/23/2018 18:41    Anti-infectives: Anti-infectives (From  admission, onward)  Start     Dose/Rate Route Frequency Ordered Stop   07/24/18 2300  metroNIDAZOLE (FLAGYL) IVPB 500 mg     500 mg 100 mL/hr over 60 Minutes Intravenous Every 8 hours 07/24/18 1645     07/24/18 1700  cefTRIAXone (ROCEPHIN) 2 g in sodium chloride 0.9 % 100 mL IVPB     2 g 200 mL/hr over 30 Minutes Intravenous Every 24 hours 07/23/18 1948     07/24/18 1000  rifaximin (XIFAXAN) tablet 550 mg     550 mg Oral 2 times daily 07/24/18 0842     07/23/18 2000  metroNIDAZOLE (FLAGYL) IVPB 500 mg  Status:  Discontinued     500 mg 100 mL/hr over 60 Minutes Intravenous Every 8 hours 07/23/18 1948 07/24/18 1645   07/23/18 1715  cefTRIAXone (ROCEPHIN) 2 g in sodium chloride 0.9 % 100 mL IVPB     2 g 200 mL/hr over 30 Minutes Intravenous  Once 07/23/18 1700 07/23/18 1744       Assessment/Plan HTN Childs C cirrhosis - ammonia elevated at 39, level for today pending Hyponatremia Anemia of chronic disease Thrombocytopenia  Possible cholecystitis Acute pancreatitis - likely gallstone - GI following - WBC normalized, afebrile - Tbili 6.8 yesterday, LFTs chronically elevated - MRCP yesterday with cholelithiasis but no sign of cholecystitis, no choledocholithiaisis - patient would be a high risk surgical patient secondary to cirrhosis  FEN: CLD VTE: SCDs, lovenox ID: [id number] 12/7>>  Patient is signing out AMA, reiterated that I would not recommend this and that if he feels worse he should return to ED.   LOS: 2 days    Wells GuilesKelly Rayburn , [facility] 07/25/2018, 11:53 AM Pager: [phone] Consults: [phone] Mon-Fri 7:00 am-4:30 pm Sat-Sun 7:00 am-11:30 am

## 2018-07-25 NOTE — Plan of Care (Signed)
  Problem: Activity: Goal: Risk for activity intolerance will decrease Outcome: Progressing   Problem: Coping: Goal: Level of anxiety will decrease Outcome: Progressing   Problem: Elimination: Goal: Will not experience complications related to urinary retention Outcome: Progressing   

## 2018-07-25 NOTE — Progress Notes (Addendum)
Pt did not want to stay any longer , spoke to Dr. Karen ChafeLockamy, and advised that the Pt wanted to leave AMA. Pt understood the risk of leaving.  Family members tried to talk to patient about staying, MDs spoke to Pt about staying.  Pt felt he was fine to go home, he did not need to be in the hospital, he would f/u with his GI Dr. Claiborne Billingshat he received last night, that he would be fine.   Pt signed the AMA paperwork and left his his family, his brother and father

## 2018-07-28 ENCOUNTER — Emergency Department (HOSPITAL_COMMUNITY)
Admission: EM | Admit: 2018-07-28 | Discharge: 2018-07-28 | Disposition: A | Discharge status: Home / Self Care | Payer: 59 | Attending: Emergency Medicine | Admitting: Emergency Medicine

## 2018-07-28 DIAGNOSIS — I10 Essential (primary) hypertension: Secondary | ICD-10-CM | POA: Insufficient documentation

## 2018-07-28 DIAGNOSIS — R1033 Periumbilical pain: Secondary | ICD-10-CM | POA: Diagnosis present

## 2018-07-28 DIAGNOSIS — Z79899 Other long term (current) drug therapy: Secondary | ICD-10-CM | POA: Insufficient documentation

## 2018-07-28 LAB — CBC WITH DIFFERENTIAL/PLATELET
Abs Immature Granulocytes: 0.16 10*3/uL — ABNORMAL HIGH (ref 0.00–0.07)
Abs Immature Granulocytes: 0.12 10*3/uL — ABNORMAL HIGH (ref 0.00–0.07)
BASOS ABS: 0.1 10*3/uL (ref 0.0–0.1)
Basophils Absolute: 0.1 10*3/uL (ref 0.0–0.1)
Basophils Relative: 1 %
Basophils Relative: 0 %
Eosinophils Absolute: 0.2 10*3/uL (ref 0.0–0.5)
Eosinophils Absolute: 0.1 10*3/uL (ref 0.0–0.5)
Eosinophils Relative: 1 %
Eosinophils Relative: 1 %
HCT: 33.2 % — ABNORMAL LOW (ref 39.0–52.0)
HCT: 32.8 % — ABNORMAL LOW (ref 39.0–52.0)
HEMOGLOBIN: 11.2 g/dL — AB (ref 13.0–17.0)
Hemoglobin: 11.8 g/dL — ABNORMAL LOW (ref 13.0–17.0)
IMMATURE GRANULOCYTES: 1 %
Immature Granulocytes: 1 %
LYMPHS ABS: 0.8 10*3/uL (ref 0.7–4.0)
Lymphocytes Relative: 9 %
Lymphocytes Relative: 6 %
Lymphs Abs: 1.8 10*3/uL (ref 0.7–4.0)
MCH: 36.5 pg — ABNORMAL HIGH (ref 26.0–34.0)
MCH: 36 pg — ABNORMAL HIGH (ref 26.0–34.0)
MCHC: 35.5 g/dL (ref 30.0–36.0)
MCHC: 34.1 g/dL (ref 30.0–36.0)
MCV: 102.8 fL — ABNORMAL HIGH (ref 80.0–100.0)
MCV: 105.5 fL — ABNORMAL HIGH (ref 80.0–100.0)
MONO ABS: 1.6 10*3/uL — AB (ref 0.1–1.0)
Monocytes Absolute: 1.3 10*3/uL — ABNORMAL HIGH (ref 0.1–1.0)
Monocytes Relative: 8 %
Monocytes Relative: 9 %
NEUTROS PCT: 83 %
Neutro Abs: 16.4 10*3/uL — ABNORMAL HIGH (ref 1.7–7.7)
Neutro Abs: 12.4 10*3/uL — ABNORMAL HIGH (ref 1.7–7.7)
Neutrophils Relative %: 80 %
PLATELETS: 118 10*3/uL — AB (ref 150–400)
Platelets: 144 10*3/uL — ABNORMAL LOW (ref 150–400)
RBC: 3.23 MIL/uL — ABNORMAL LOW (ref 4.22–5.81)
RBC: 3.11 MIL/uL — ABNORMAL LOW (ref 4.22–5.81)
RDW: 14.6 % (ref 11.5–15.5)
RDW: 14.7 % (ref 11.5–15.5)
WBC: 20.3 10*3/uL — ABNORMAL HIGH (ref 4.0–10.5)
WBC: 14.8 10*3/uL — ABNORMAL HIGH (ref 4.0–10.5)
nRBC: 0 % (ref 0.0–0.2)
nRBC: 0 % (ref 0.0–0.2)

## 2018-07-28 LAB — BASIC METABOLIC PANEL
Anion gap: 13 (ref 5–15)
BUN: 11 mg/dL (ref 6–20)
CO2: 22 mmol/L (ref 22–32)
CREATININE: 1.09 mg/dL (ref 0.61–1.24)
Calcium: 9 mg/dL (ref 8.9–10.3)
Chloride: 96 mmol/L — ABNORMAL LOW (ref 98–111)
GFR calc Af Amer: >60 mL/min (ref >60)
GFR calc non Af Amer: >60 mL/min (ref >60)
Glucose, Bld: 142 mg/dL — ABNORMAL HIGH (ref 70–99)
Potassium: 3.7 mmol/L (ref 3.5–5.1)
Sodium: 131 mmol/L — ABNORMAL LOW (ref 135–145)

## 2018-07-28 LAB — COMPREHENSIVE METABOLIC PANEL
ALT: 65 U/L — ABNORMAL HIGH (ref 0–44)
AST: 118 U/L — ABNORMAL HIGH (ref 15–41)
Albumin: 2.5 g/dL — ABNORMAL LOW (ref 3.5–5.0)
Alkaline Phosphatase: 77 U/L (ref 38–126)
Anion gap: 11 (ref 5–15)
BUN: 11 mg/dL (ref 6–20)
CALCIUM: 8.9 mg/dL (ref 8.9–10.3)
CHLORIDE: 94 mmol/L — AB (ref 98–111)
CO2: 26 mmol/L (ref 22–32)
Creatinine, Ser: 1.04 mg/dL (ref 0.61–1.24)
GFR calc Af Amer: >60 mL/min (ref >60)
GFR calc non Af Amer: >60 mL/min (ref >60)
Glucose, Bld: 146 mg/dL — ABNORMAL HIGH (ref 70–99)
Potassium: 3.7 mmol/L (ref 3.5–5.1)
Sodium: 131 mmol/L — ABNORMAL LOW (ref 135–145)
Total Bilirubin: 6.8 mg/dL — ABNORMAL HIGH (ref 0.3–1.2)
Total Protein: 8.1 g/dL (ref 6.5–8.1)

## 2018-07-28 LAB — RAPID URINE DRUG SCREEN, HOSP PERFORMED
Amphetamines: NOT DETECTED
Barbiturates: NOT DETECTED
Benzodiazepines: NOT DETECTED
Cocaine: NOT DETECTED
Opiates: NOT DETECTED
Tetrahydrocannabinol: NOT DETECTED

## 2018-07-28 LAB — LIPASE, BLOOD
Lipase: 214 U/L — ABNORMAL HIGH (ref 11–51)
Lipase: 186 U/L — ABNORMAL HIGH (ref 11–51)

## 2018-07-28 LAB — AMMONIA: Ammonia: 26 umol/L (ref 9–35)

## 2018-07-28 LAB — ETHANOL: Alcohol, Ethyl (B): <10 mg/dL (ref <10)

## 2018-07-28 LAB — LACTATE DEHYDROGENASE: LDH: 196 U/L — AB (ref 98–192)

## 2018-07-28 MED ORDER — PROMETHAZINE HCL 25 MG RE SUPP: 25.0000 mg | Freq: Four times a day (QID) | RECTAL | 0 refills | Status: AC | PRN

## 2018-07-28 MED ORDER — SODIUM CHLORIDE 0.9 % IV BOLUS
1000.0000 mL | Freq: Once | INTRAVENOUS | Status: AC
  Administered 2018-07-28: 1000 mL via INTRAVENOUS

## 2018-07-28 MED ORDER — ONDANSETRON 8 MG PO TBDP: 8.0000 mg | ORAL_TABLET | Freq: Three times a day (TID) | ORAL | 0 refills | Status: AC | PRN

## 2018-07-28 MED ORDER — OXYCODONE HCL ER 10 MG PO T12A: 10.0000 mg | EXTENDED_RELEASE_TABLET | Freq: Two times a day (BID) | ORAL | 0 refills | Status: AC

## 2018-07-28 NOTE — Discharge Instructions (Signed)
Continue a clear liquid diet for at least 1 week.  Continue taking your prescribed medications.  Follow-up with Dr. Matthias HughsBuccini as scheduled.  We also advise follow-up with a primary care doctor.  You may return to the ED for any new or concerning symptoms.

## 2018-07-28 NOTE — ED Triage Notes (Signed)
Pt in from home with c/o generalized abd pain, onset early last night. C/o n/v, no diarrhea. Recently admitted for pancreatitis on Sat. First solid food last night, pain then began

## 2018-07-28 NOTE — ED Notes (Signed)
Patient verbalizes understanding of discharge instructions. Opportunity for questioning and answers were provided. Armband removed by staff, pt discharged from ED. Pt wheeled out into lobby with discharge papers and belongings

## 2018-07-28 NOTE — ED Provider Notes (Signed)
[facility] EMERGENCY DEPARTMENT Provider Note   CSN: [id number] Arrival date & time: 07/28/18  0232     History   Chief Complaint Chief Complaint  Patient presents with  . Abdominal Pain    HPI Alfred Kelley is a 62 y.o. male.   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  States that he developed sharp periumbilical abdominal pain tonight shortly after transitioning from a liquid diet to more solid foods.  Reports eating eggs with toast and strawberry jelly.  Shortly after noted nonradiating periumbilical pain with associated nausea and 5-6 episodes of vomiting.  Thinks that some of his emesis may have had specks of blood, though also states it may have been the jam he had eaten prior.  Denies taking any medications prior to arrival for pain.  Has had spontaneous improvement in his symptoms.  No longer has any specific complaints.  He is intermittently agitated.  Has a history of recent admission for pancreatitis felt secondary to either alcohol use or gallstones.  Left the hospital AGAINST MEDICAL ADVICE 3 days ago.  Did follow-up with Dr. Matthias Hughs in the office today.  States that he had a reassuring visit.  He has not had any prior abdominal surgeries.  Family reports 42 days of sobriety.     Past Medical History:  Diagnosis Date  . Alcoholism (HCC)   . Hyperlipidemia   . Hypertension   . Kidney stones     Patient Active Problem List   Diagnosis Date Noted  . Hepatic encephalopathy (HCC) 07/24/2018  . Acute acalculous cholecystitis   . Alcoholic cirrhosis of liver without ascites (HCC)   . Acute pancreatitis 07/23/2018  . Acute on chronic anemia   . Macrocytic anemia   . Benign essential HTN   . Transaminitis   . Thrombocytopenia (HCC)   . Ascites   . Alcohol  withdrawal syndrome, with delirium (HCC)   . Essential hypertension   . Hyperlipidemia   . Abdomen enlarged   . Acute liver failure 06/18/2018  . Alcohol abuse   . Liver dysfunction 06/17/2018  . Altered mental status   . Hyperbilirubinemia   . Hyponatremia     No past surgical history on file.      Home Medications    Prior to Admission medications   Medication Sig Start Date End Date Taking? Authorizing Provider  lactulose (CHRONULAC) 10 GM/15ML solution Take 30 mLs (20 g total) by mouth 2 (two) times daily. Patient taking differently: Take 20 g by mouth 3 (three) times daily.  06/30/18  Yes Garth Bigness, MD  MELATONIN PO Take 6 mg by mouth at bedtime.   Yes [provider]  Multiple Vitamin (MULTIVITAMIN WITH MINERALS) TABS tablet Take 1 tablet by mouth daily. 06/30/18  Yes Garth Bigness, MD  rifaximin (XIFAXAN) 550 MG TABS tablet Take 1 tablet (550 mg total) by mouth 2 (two) times daily. 06/30/18  Yes Garth Bigness, MD  feeding supplement, ENSURE ENLIVE, (ENSURE ENLIVE) LIQD Take 237 mLs by mouth 2 (two) times daily between meals. Patient not taking: Reported on 07/23/2018 06/30/18   Garth Bigness, MD  folic acid (FOLVITE) 1 MG tablet Take 1 tablet (1 mg total) by mouth daily. Patient not taking: Reported on 07/28/2018 06/30/18   Garth Bigness, MD  spironolactone (ALDACTONE) 100 MG tablet Take 1 tablet (100 mg total) by  mouth 2 (two) times daily. Patient not taking: Reported on 07/28/2018 06/30/18   Garth Bignessimberlake, Kathryn, MD  thiamine 100 MG tablet Take 1 tablet (100 mg total) by mouth daily. Patient not taking: Reported on 07/28/2018 06/30/18   Garth Bignessimberlake, Kathryn, MD  torsemide (DEMADEX) 20 MG tablet Take 1 tablet (20 mg total) by mouth 2 (two) times daily. Patient not taking: Reported on 07/28/2018 06/30/18   Garth Bignessimberlake, Kathryn, MD    Family History No family history on file.  Social History Social History   Tobacco Use  .  Smoking status: Never Smoker  . Smokeless tobacco: Never Used  Substance Use Topics  . Alcohol use: Yes    Alcohol/week: 5.0 standard drinks    Types: 5 Glasses of wine per week    Comment: daily  . Drug use: Never     Allergies   Patient has no known allergies.   Review of Systems Review of Systems Ten systems reviewed and are negative for acute change, except as noted in the HPI.    Physical Exam Updated Vital Signs BP 105/65   Pulse (!) 111   Resp 13   SpO2 98%   Physical Exam Vitals signs and nursing note reviewed.  Constitutional:      General: He is not in acute distress.    Appearance: He is well-developed. He is not diaphoretic.     Comments: Intermittently agitated.   HENT:     Head: Normocephalic and atraumatic.     Comments: Facial telangiectasias consistent with history of alcohol abuse Eyes:     Conjunctiva/sclera: Conjunctivae normal.     Comments: Sclera appear mildly icteric.  Neck:     Musculoskeletal: Normal range of motion.  Cardiovascular:     Rate and Rhythm: Regular rhythm. Tachycardia present.     Pulses: Normal pulses.  Pulmonary:     Effort: Pulmonary effort is normal. No respiratory distress.     Comments: Respirations even and unlabored Abdominal:     General: There is no distension.     Palpations: Abdomen is soft. There is no mass.     Tenderness: There is no guarding.     Comments: Soft, nontender, nondistended abdomen.  Musculoskeletal: Normal range of motion.  Skin:    General: Skin is warm and dry.     Coloration: Skin is not pale.     Findings: No erythema or rash.     Comments: Very mild jaundice to skin  Neurological:     Mental Status: He is alert and oriented to person, place, and time.  Psychiatric:        Behavior: Behavior normal.      ED Treatments / Results  Labs (all labs ordered are listed, but only abnormal results are displayed) Labs Reviewed  CBC WITH DIFFERENTIAL/PLATELET - Abnormal; Notable for the  following components:      Result Value   WBC 14.8 (*)    RBC 3.11 (*)    Hemoglobin 11.2 (*)    HCT 32.8 (*)    MCV 105.5 (*)    MCH 36.0 (*)    Platelets 118 (*)    Neutro Abs 12.4 (*)    Monocytes Absolute 1.3 (*)    Abs Immature Granulocytes 0.12 (*)    All other components within normal limits  COMPREHENSIVE METABOLIC PANEL - Abnormal; Notable for the following components:   Sodium 131 (*)    Chloride 94 (*)    Glucose, Bld 146 (*)    Albumin 2.5 (*)  AST 118 (*)    ALT 65 (*)    Total Bilirubin 6.8 (*)    All other components within normal limits  LIPASE, BLOOD - Abnormal; Notable for the following components:   Lipase 186 (*)    All other components within normal limits  LACTATE DEHYDROGENASE - Abnormal; Notable for the following components:   LDH 196 (*)    All other components within normal limits  ETHANOL  RAPID URINE DRUG SCREEN, HOSP PERFORMED  AMMONIA    EKG None  Radiology No results found.  Procedures Procedures (including critical care time)  Medications Ordered in ED Medications  sodium chloride 0.9 % bolus 1,000 mL (has no administration in time range)  sodium chloride 0.9 % bolus 1,000 mL (0 mLs Intravenous Stopped 07/28/18 0518)    6:35 AM On chart review, patient noted to be persistently tachycardic throughout his last admission as well as dating back to November.  His lipase is only slightly elevated.  LFTs are largely unchanged from hospital discharge.  He has a Ranson score of 2 consistent with low predicted mortality for acute pancreatitis.  He has not had any complaints of abdominal pain since my initial assessment.  Will give second IV fluid bolus and monitor until completed.  If patient remains pain-free, I do not believe it is unreasonable for him to be discharged with continued plan for clear liquid diet and gastroenterology follow-up.  7:23 AM Patient continues to rest without pain. 2nd liter of IVF initiated.   Initial  Impression / Assessment and Plan / ED Course  I have reviewed the triage vital signs and the nursing notes.  Pertinent labs & imaging results that were available during my care of the patient were reviewed by me and considered in my medical decision making (see chart for details).     62 year old male presents to the emergency department for an episode of periumbilical, sharp abdominal pain.  He had 5-6 episodes of vomiting prior to arrival.  Had resolution of nausea, vomiting, pain on arrival to the ED.  Noted to have a soft, nontender abdomen.  No peritoneal signs or guarding.  The patient has been monitored for 5 hours in the emergency department without recurrence of pain.  Repeat abdominal exam is stable.  His laboratory evaluation is notable for a leukocytosis, though this is suspected to be secondary to the stress of emesis prior to arrival.  LFTs, LDH, and lipase are stable compared with recent hospital discharge.  Negative UDS, ethanol.  Ammonia level is normal.  Pain began after patient attempted to advance his diet from clear liquids.  He will be placed back on a clear liquid diet for 1 week.  I believe the patient is stable for discharge following completion of his second IV fluid bolus.  I do not believe further emergent work-up or imaging is indicated, especially since the patient has been symptom free for the past 5 hours.  Will provide return precautions; encouraged that he continue follow up with his GI physician.    Patient disposition to be set by Bradd Canary at completion of IVF if no clinical decompensation.  Vitals:   07/28/18 0500 07/28/18 0515 07/28/18 0530 07/28/18 0545  BP: 120/78 119/70 (!) 146/84 105/65  Pulse: (!) 105 (!) 110 (!) 111 (!) 111  Resp: 15 18 19 13   SpO2: 99% 100% 98% 98%    Final Clinical Impressions(s) / ED Diagnoses   Final diagnoses:  Periumbilical abdominal pain    ED Discharge  Orders    None       Antony Madura, PA-C 07/28/18 1610     Derwood Kaplan, MD 07/28/18 [phone]

## 2019-01-28 ENCOUNTER — Other Ambulatory Visit: Payer: Self-pay | Admitting: Student in an Organized Health Care Education/Training Program

## 2019-02-07 ENCOUNTER — Other Ambulatory Visit: Payer: Self-pay | Admitting: Student in an Organized Health Care Education/Training Program

## 2019-02-09 ENCOUNTER — Other Ambulatory Visit: Payer: Self-pay | Admitting: Student in an Organized Health Care Education/Training Program

## 2019-05-05 ENCOUNTER — Other Ambulatory Visit: Payer: Self-pay | Admitting: Student in an Organized Health Care Education/Training Program

## 2019-05-08 ENCOUNTER — Other Ambulatory Visit: Payer: Self-pay | Admitting: Gastroenterology

## 2019-05-08 DIAGNOSIS — K7030 Alcoholic cirrhosis of liver without ascites: Secondary | ICD-10-CM

## 2019-05-09 ENCOUNTER — Other Ambulatory Visit: Payer: 59

## 2019-05-16 ENCOUNTER — Ambulatory Visit
Admission: RE | Admit: 2019-05-16 | Discharge: 2019-05-16 | Disposition: A | Discharge status: Home / Self Care | Payer: 59 | Source: Ambulatory Visit | Attending: Gastroenterology | Admitting: Gastroenterology

## 2019-05-16 DIAGNOSIS — K7030 Alcoholic cirrhosis of liver without ascites: Secondary | ICD-10-CM

## 2019-09-11 IMAGING — US US PARACENTESIS
1 series · 13 of 14 positions shown · non-contrast
Comparison: none

INDICATION: History of ETOH cirrhosis, acute liver failure, ETOH withdrawal. New
onset ascites - request for diagnostic and therapeutic paracentesis.

[Series 1: us paracentesis · 0.25mm/px · 13 of 14 slices shown]
[im 1/14]
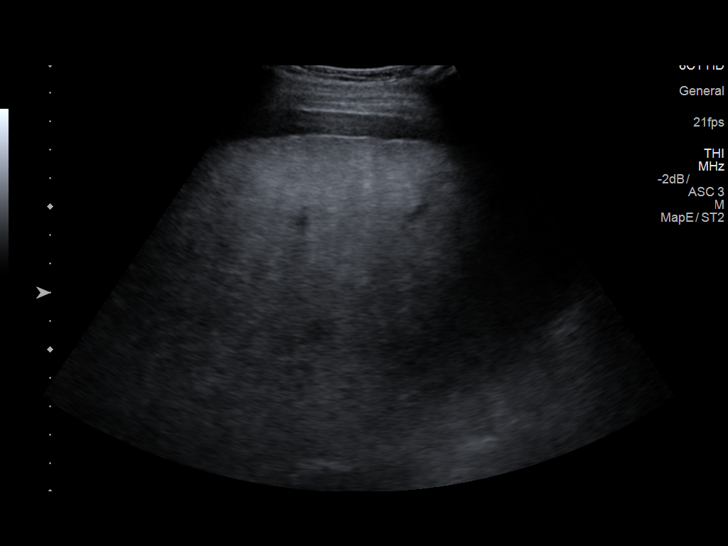
[im 2/14]
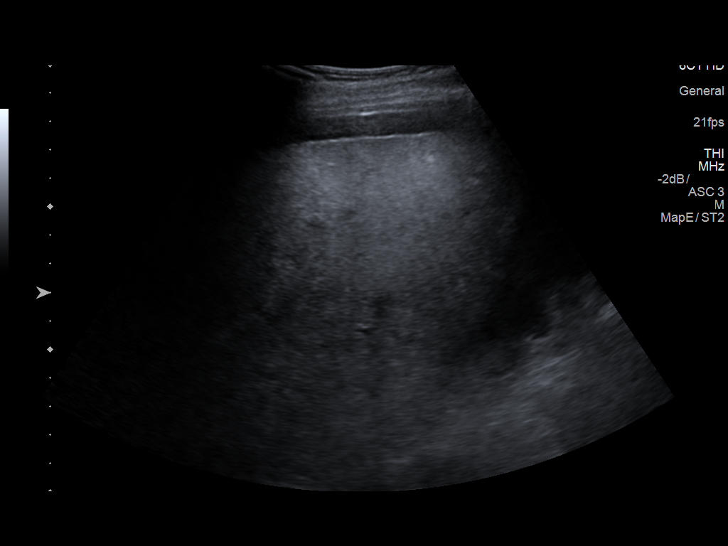
[im 3/14]
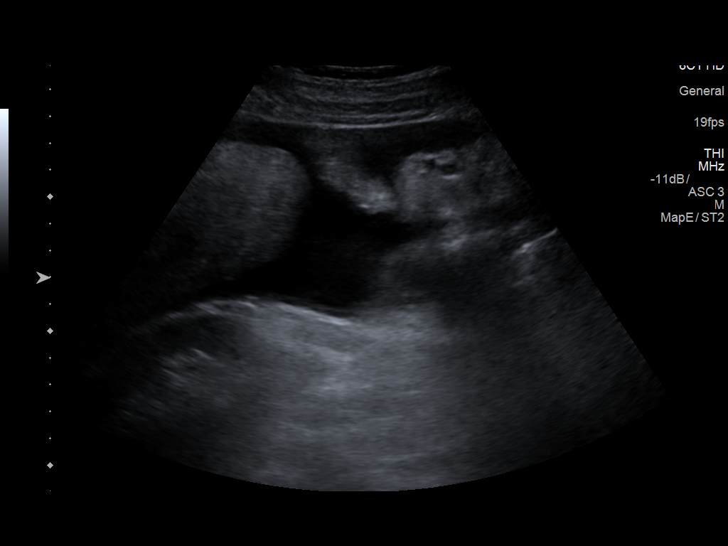
[im 4/14]
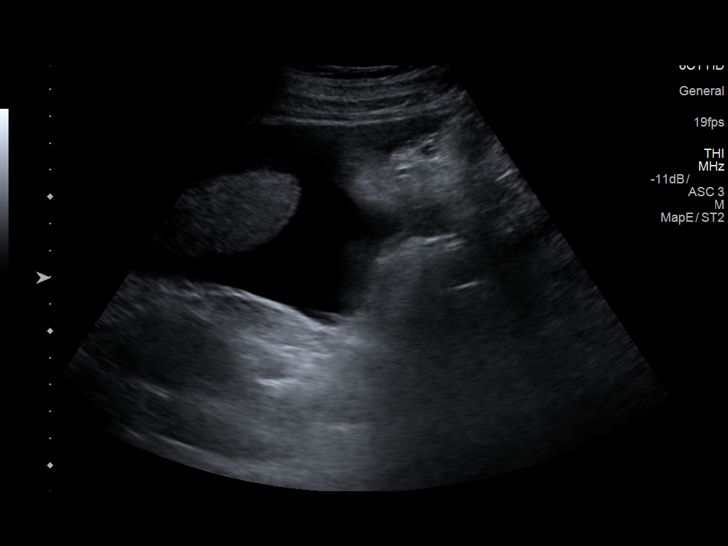
[im 5/14]
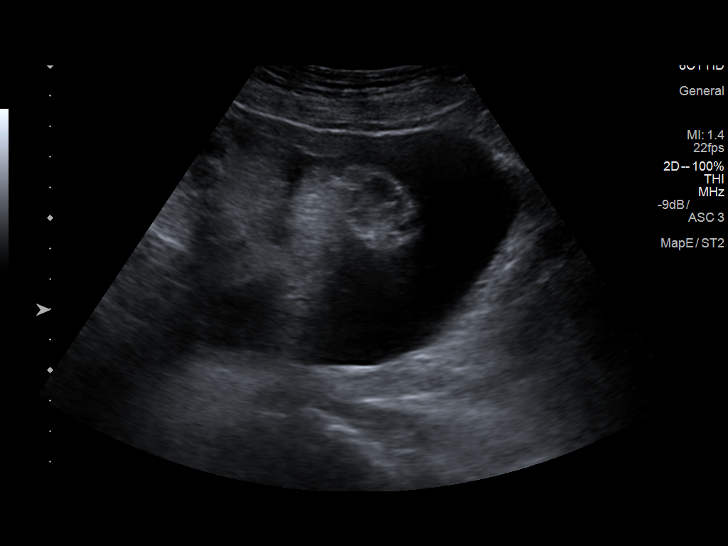
[im 6/14]
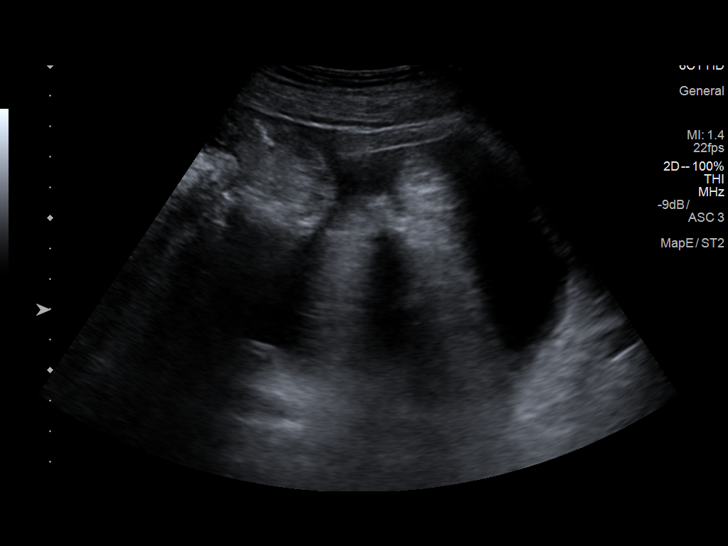
[im 8/14]
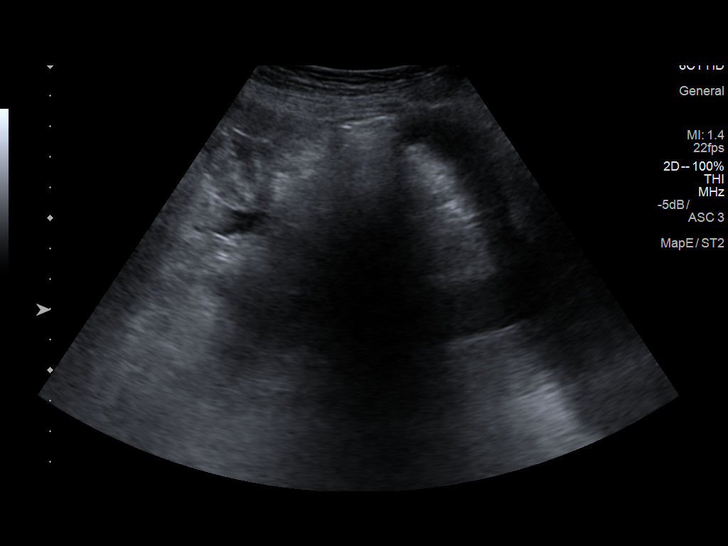
[im 9/14]
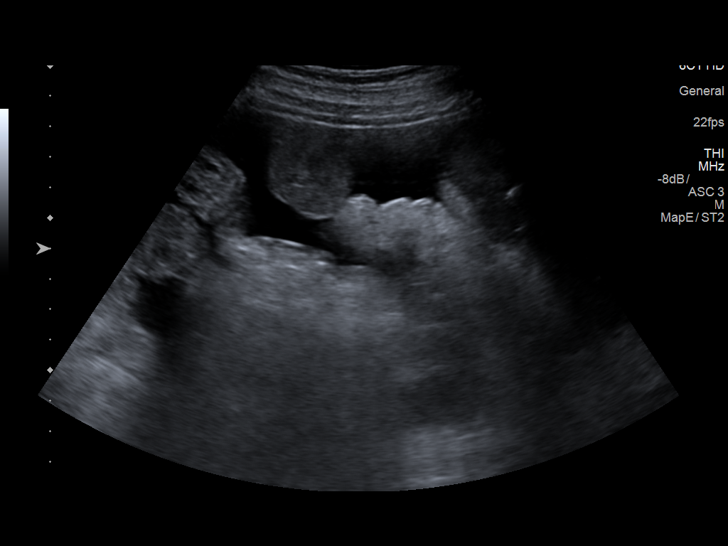
[im 10/14]
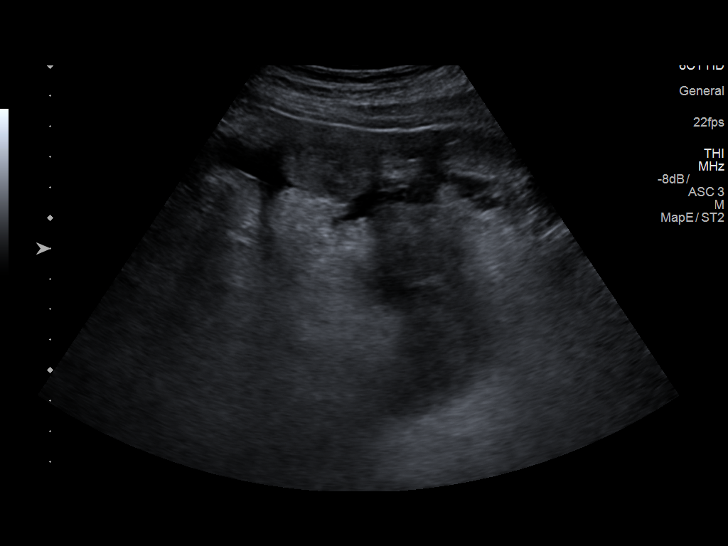
[im 11/14]
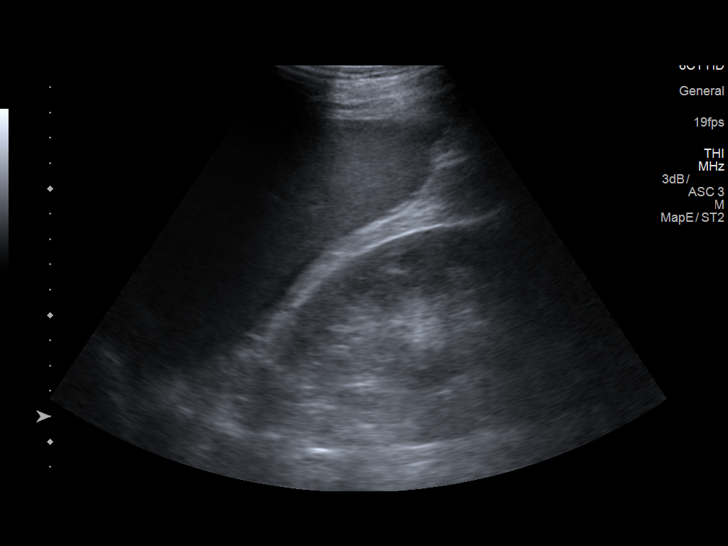
[im 12/14]
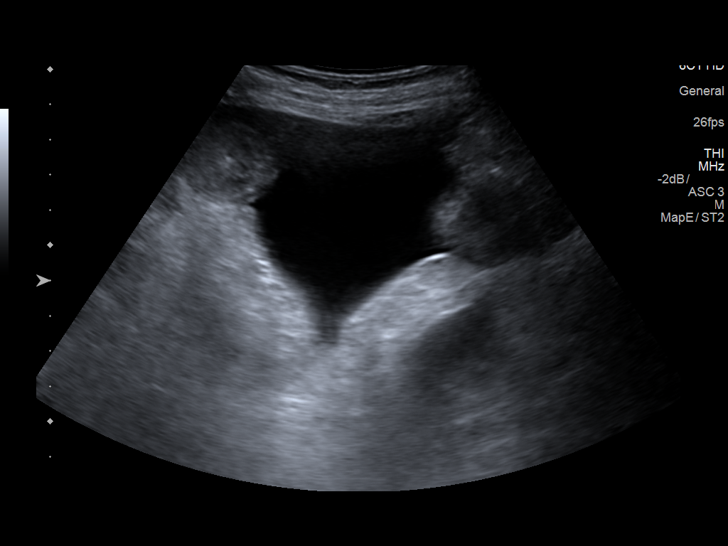
[im 13/14]
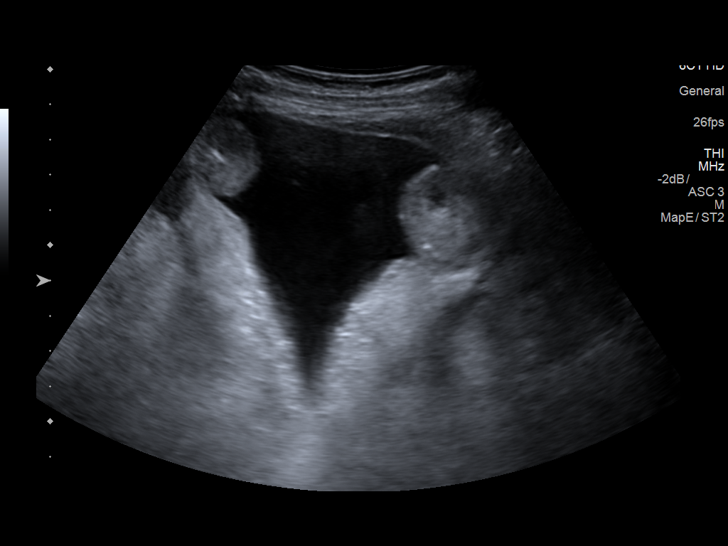
[im 14/14]
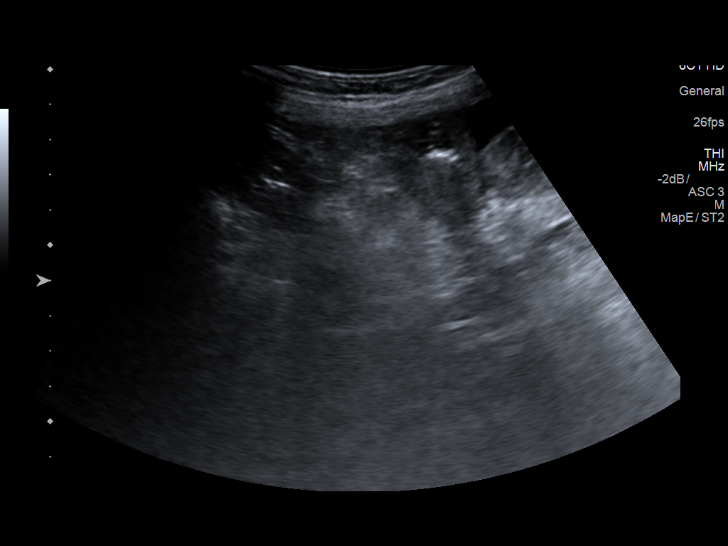

[13 of 14 positions shown; findings below may reference images not displayed]

EXAM:
ULTRASOUND GUIDED DIAGNOSTIC AND THERAPEUTIC PARACENTESIS

MEDICATIONS:
10 mL 1% lidocaine.

COMPLICATIONS:
None immediate.

PROCEDURE:
Informed written consent was obtained from the patient after a
discussion of the risks, benefits and alternatives to treatment. A
timeout was performed prior to the initiation of the procedure.

Initial ultrasound scanning demonstrates a small amount of ascites
within the left lower abdominal quadrant. The left lower abdomen was
prepped and draped in the usual sterile fashion. 1% lidocaine was
used for local anesthesia.

Following this, a 6 Fr Safe-T-Centesis catheter was introduced. An
ultrasound image was saved for documentation purposes. The
paracentesis was performed. The catheter was removed and a dressing
was applied. The patient tolerated the procedure well without
immediate post procedural complication.
FINDINGS: A total of approximately 1.0 L of clear yellow fluid was removed.
Samples were sent to the laboratory as requested by the clinical
team.
IMPRESSION: Successful ultrasound-guided paracentesis yielding 1.0 liters of
peritoneal fluid.

Read by Jumper, Clementina

## 2020-05-30 IMAGING — CT CT ABD-PELV W/ CM
2 of 5 series · 16 of 46 positions shown, 18 images · IV contrast (omnipaque)
Comparison: None.

CLINICAL DATA: 61 y/o  M; acute generalized abdominal pain.

EXAM:
CT ABDOMEN AND PELVIS WITH CONTRAST
TECHNIQUE: Multidetector CT imaging of the abdomen and pelvis was performed
using the standard protocol following bolus administration of
intravenous contrast.
CONTRAST:  100 cc Omnipaque 300

[Series 4: a/p w/ 5mm · axial · 0.96mm/px · z∈[-540,-90]mm · 13 of 102 slices shown, 15 images]
[im 6/102  soft-tissue]
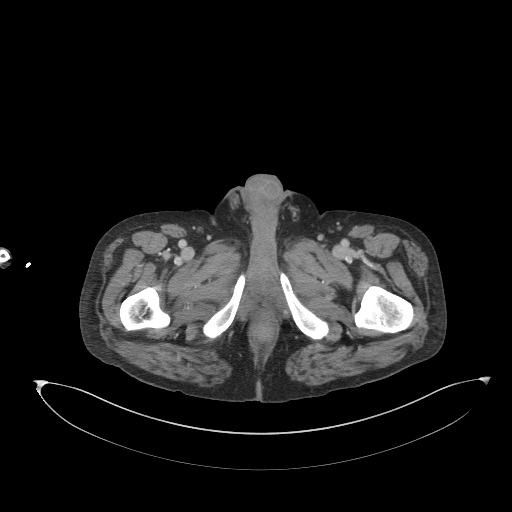
[im 6/102  bone]
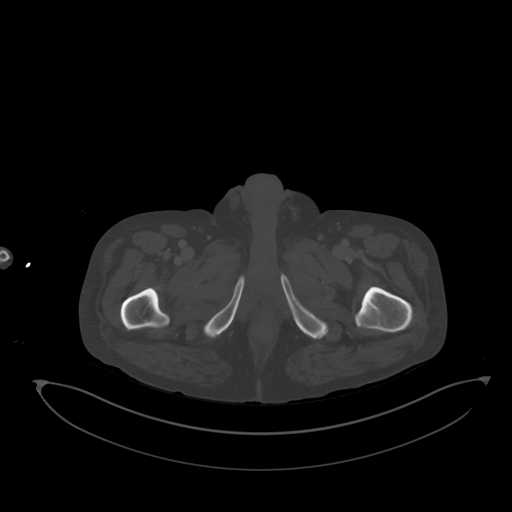
[im 16/102  soft-tissue]
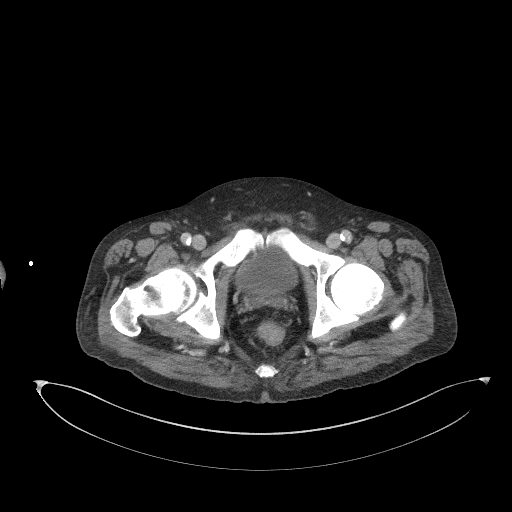
[im 22/102  soft-tissue]
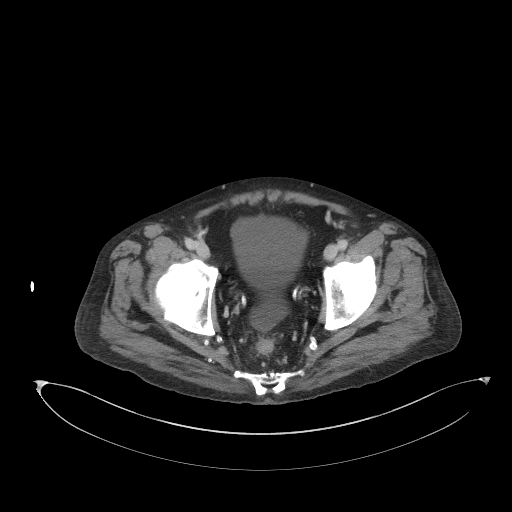
[im 27/102  soft-tissue]
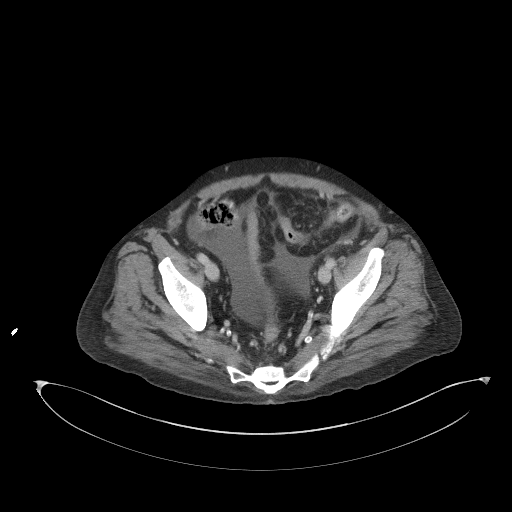
[im 38/102  soft-tissue]
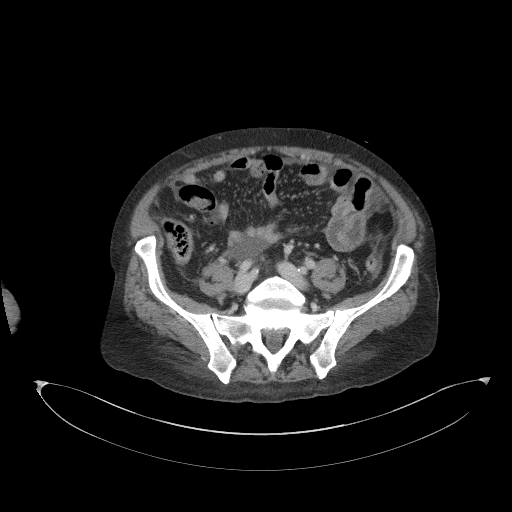
[im 43/102  soft-tissue]
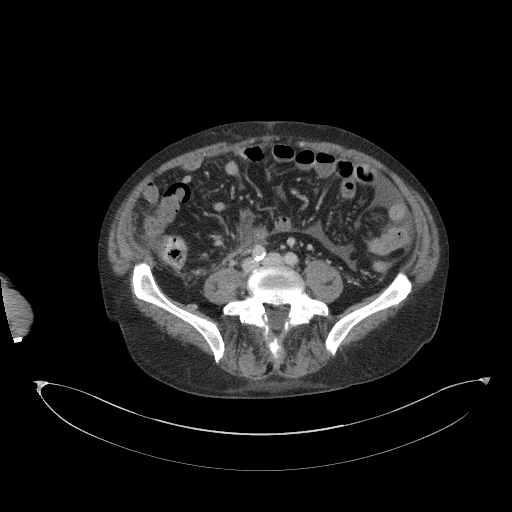
[im 54/102  soft-tissue]
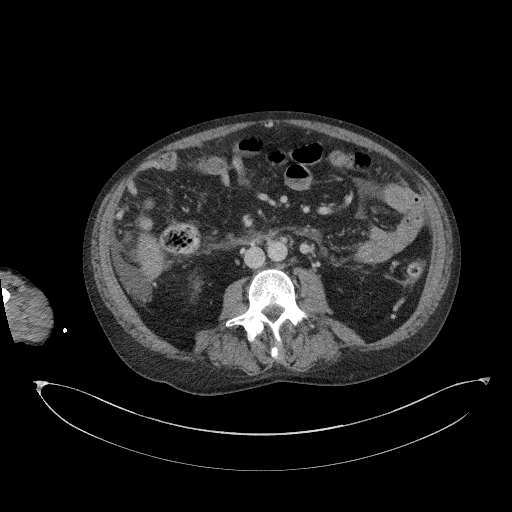
[im 59/102  soft-tissue]
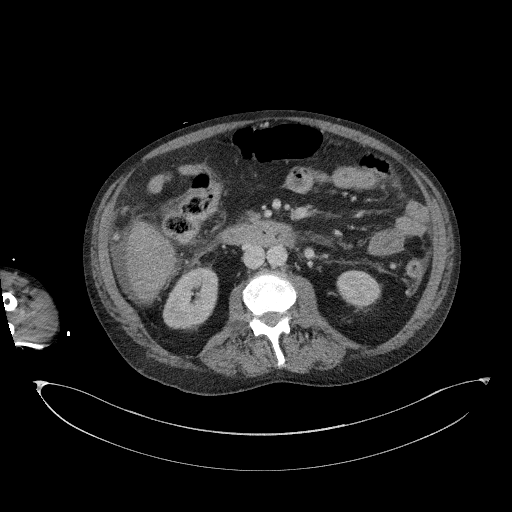
[im 64/102  soft-tissue]
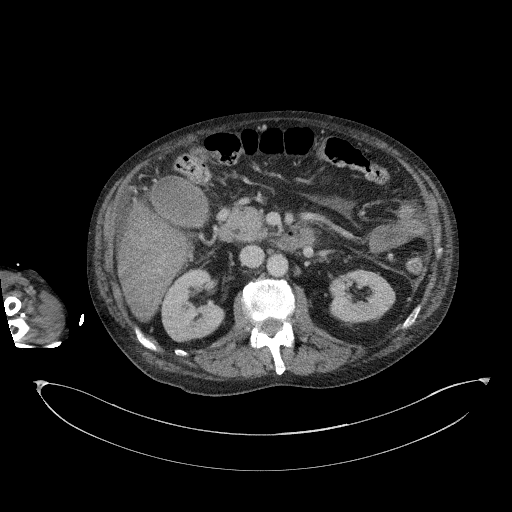
[im 64/102  bone]
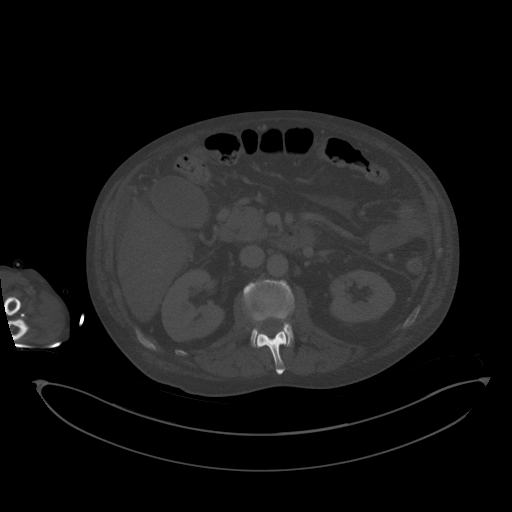
[im 75/102  soft-tissue]
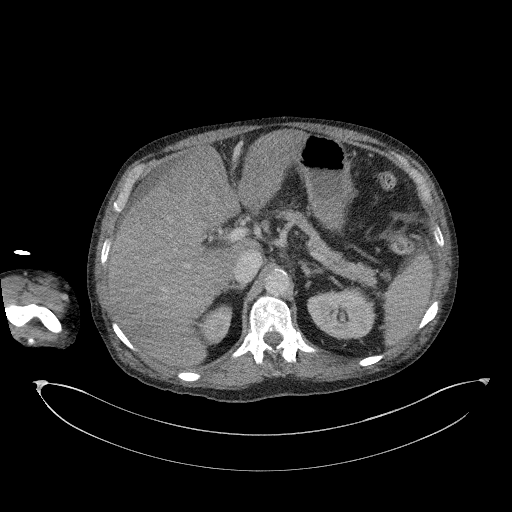
[im 80/102  soft-tissue]
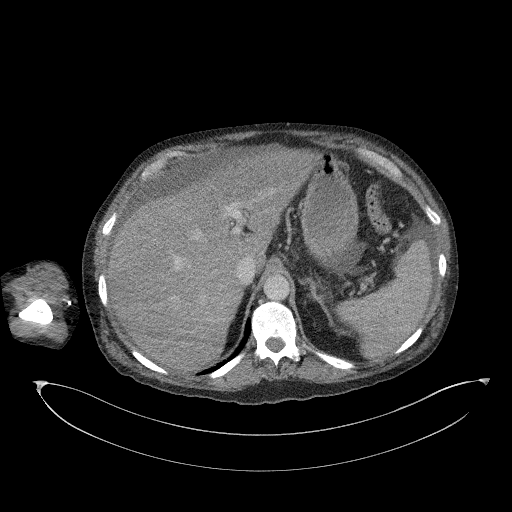
[im 86/102  soft-tissue]
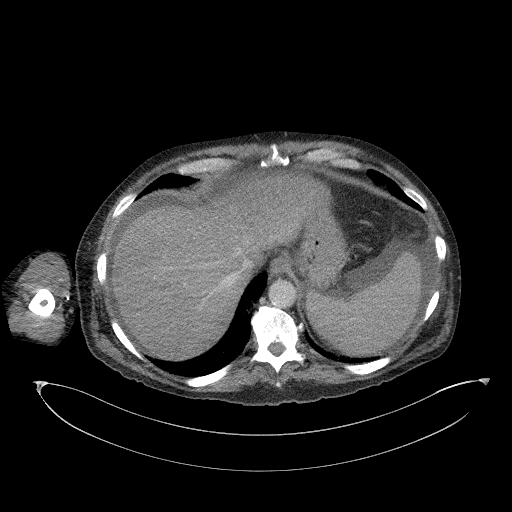
[im 96/102  soft-tissue]
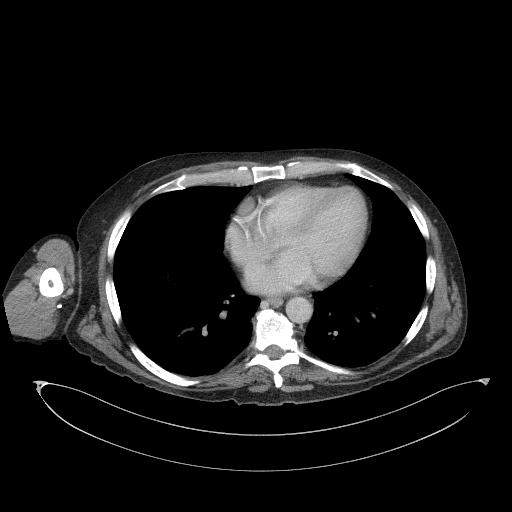

[Series 7: a/p w/ cor · coronal · 0.79mm/px · 3 of 150 slices shown]
[im 50/150  soft-tissue]
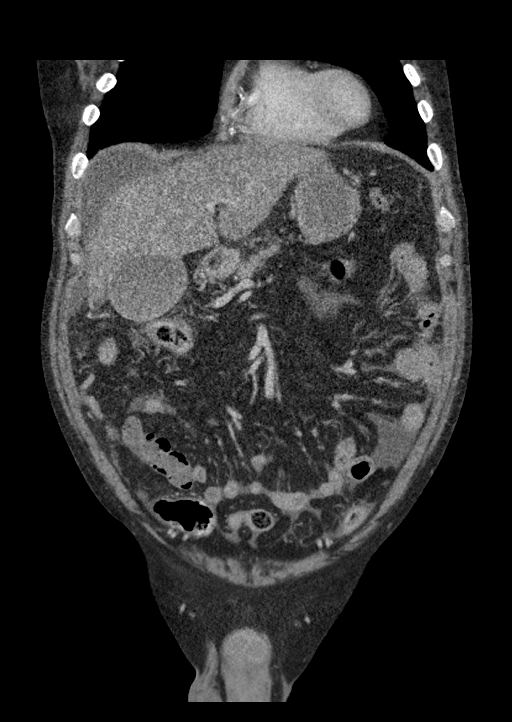
[im 67/150  soft-tissue]
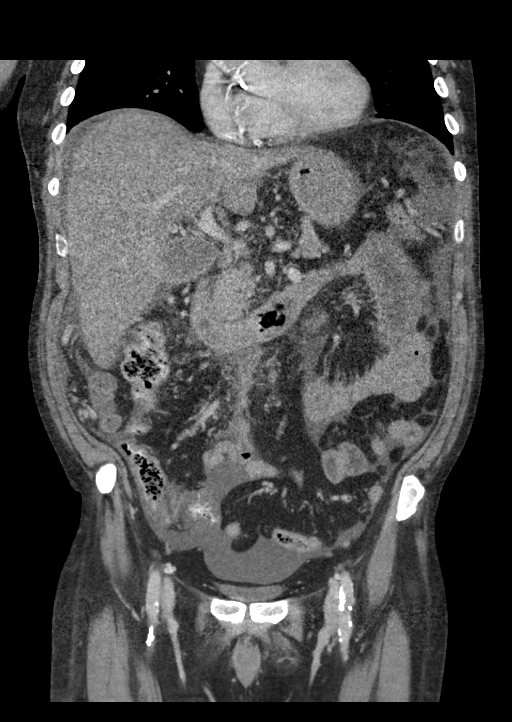
[im 83/150  soft-tissue]
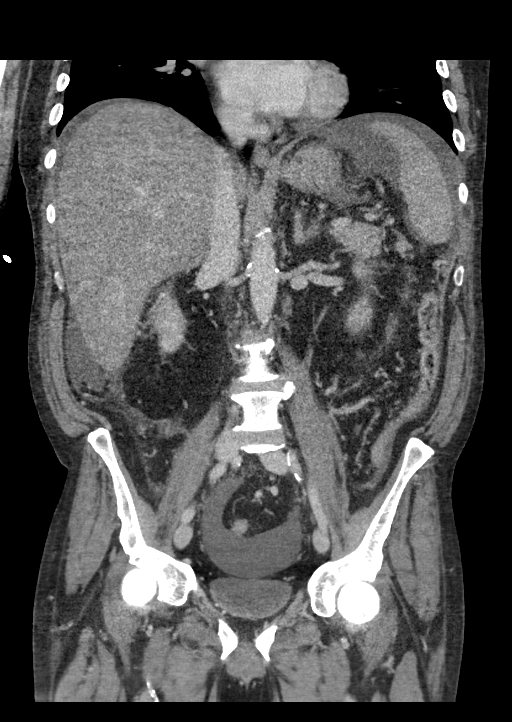

[16 of 46 positions shown; findings below may reference images not displayed]

FINDINGS: Lower chest: No acute abnormality.

Hepatobiliary: Decreased attenuation in diffuse heterogeneity of the
liver. Recanalization of umbilical vein and mesenteric
collateralization. No focal liver lesion. Normal appearance of the
gallbladder. No biliary ductal dilatation.

Pancreas: Unremarkable. No pancreatic ductal dilatation or
surrounding inflammatory changes.

Spleen: Normal in size without focal abnormality.

Adrenals/Urinary Tract: Adrenal glands are unremarkable. Punctate
left kidney interpolar nonobstructing stone. Ureter stone or
hydronephrosis. Normal bladder.

Stomach/Bowel: Stomach is within normal limits. Appendix appears
normal. No evidence of bowel wall thickening, distention, or
inflammatory changes.

Vascular/Lymphatic: Aortic atherosclerosis. No enlarged abdominal or
pelvic lymph nodes.

Reproductive: Prostate is unremarkable.

Other: Small volume of ascites.

Musculoskeletal: No fracture is seen.
IMPRESSION: 1. Heterogeneity of the liver parenchyma, small volume ascites,
recanalization of the umbilical vein, and mesenteric collaterals.
Findings probably represent cirrhosis, possibly concurrent acute
hepatitis.
2.  Aortic Atherosclerosis ([id number]).
3. Punctate left kidney nonobstructing stone.

By: Lap Fung Bakul Lombok M.D.

## 2021-04-28 IMAGING — US US ABDOMEN COMPLETE
1 series · 14 of 25 positions shown · non-contrast
Comparison: MRI 07/24/2018.  Ultrasound 07/23/2018.

CLINICAL DATA: Alcoholic cirrhosis.

EXAM:
ABDOMEN ULTRASOUND COMPLETE

[Series 1: us abdomen complete · 0.20mm/px · 14 of 88 slices shown]
[im 1/88]
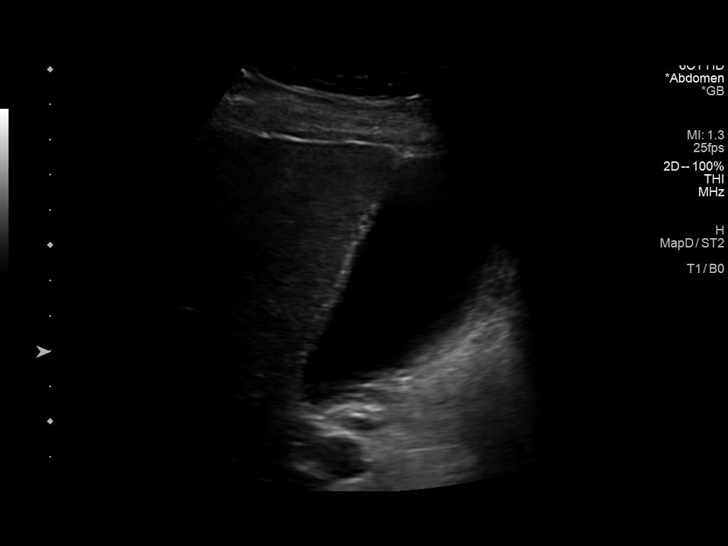
[im 8/88]
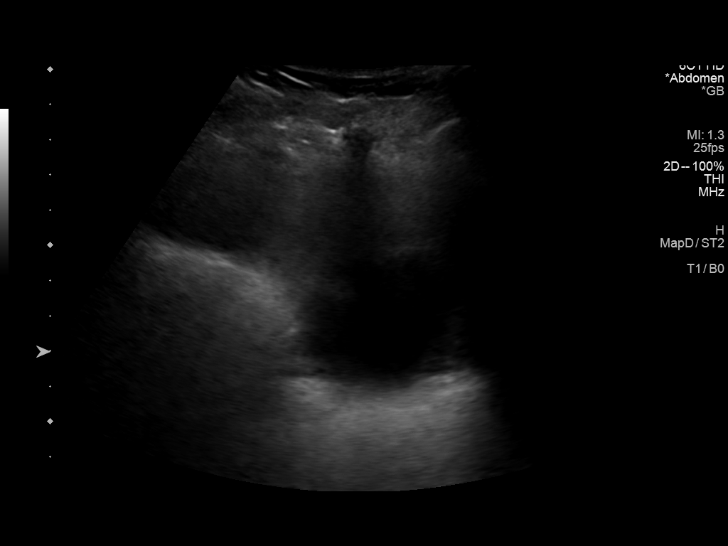
[im 15/88]
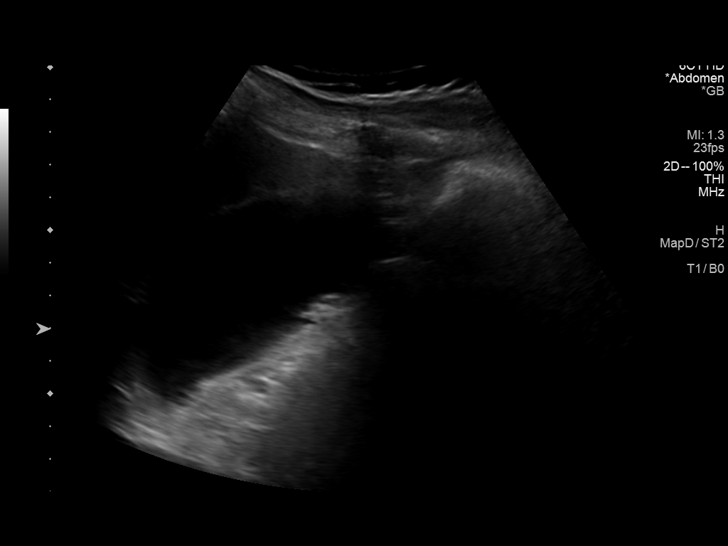
[im 22/88]
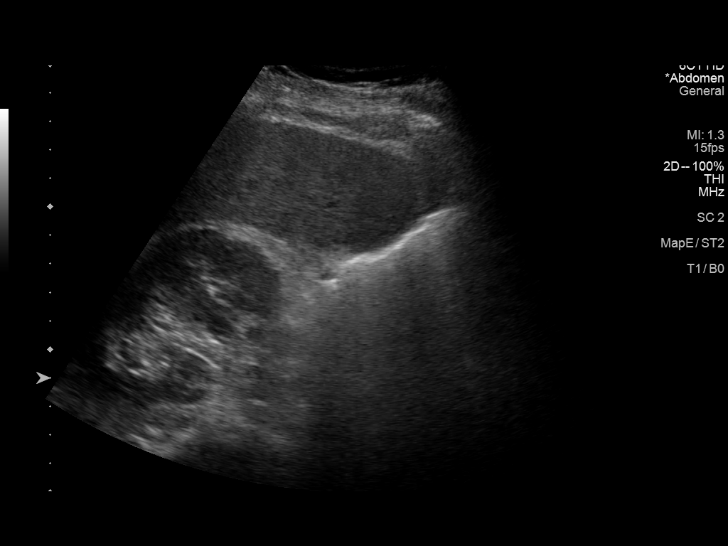
[im 30/88]
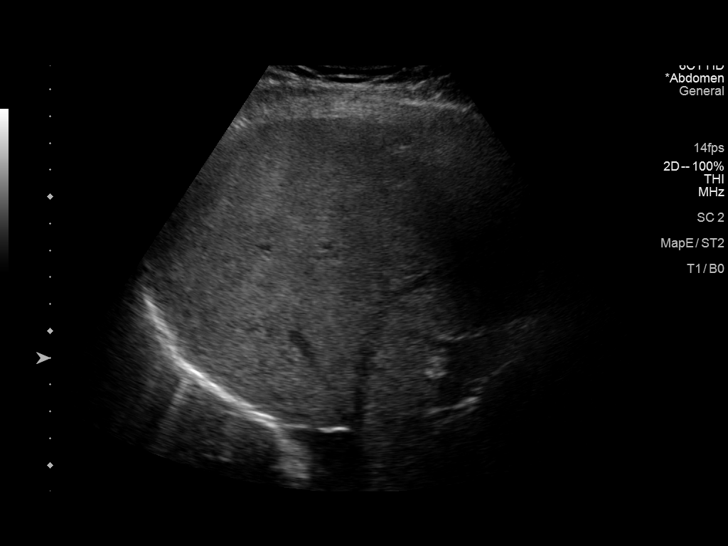
[im 33/88]
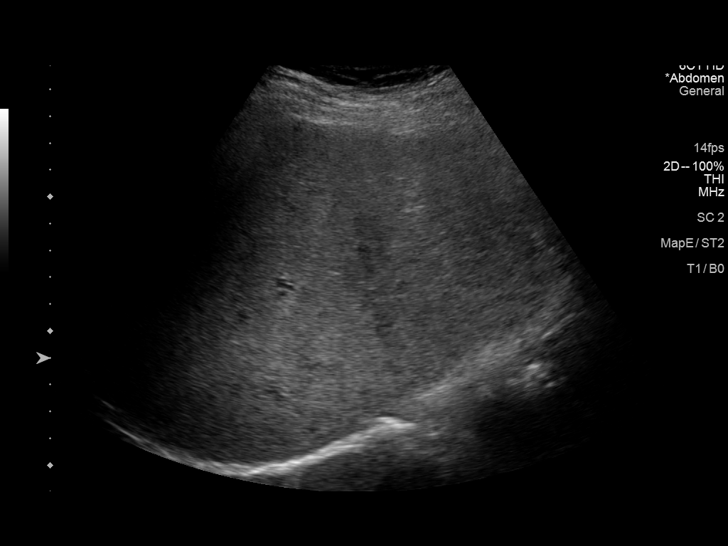
[im 40/88]
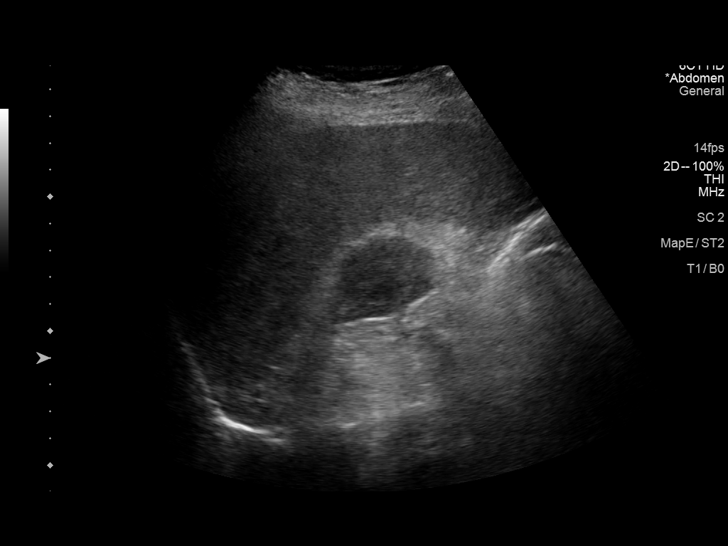
[im 48/88]
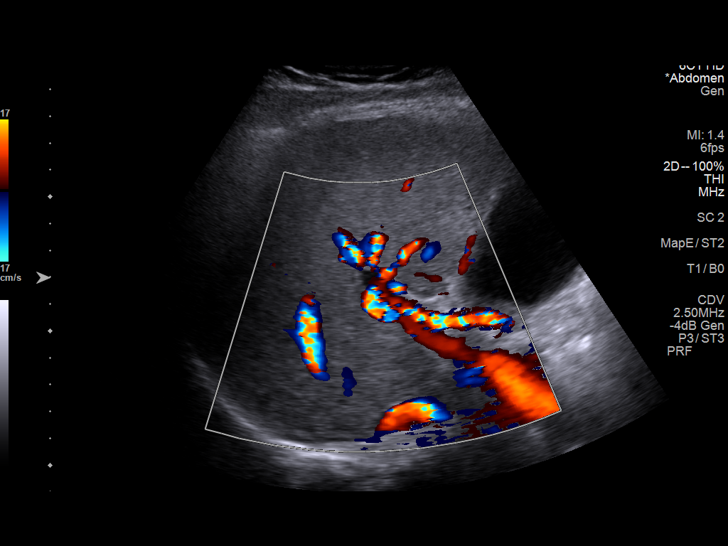
[im 55/88]
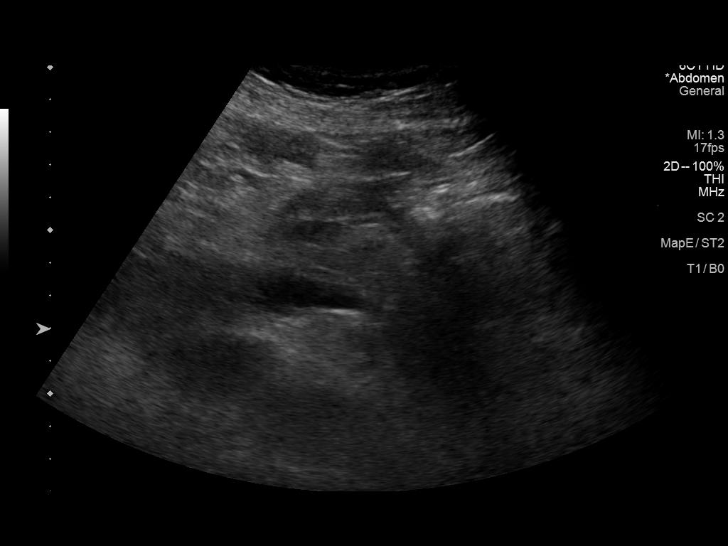
[im 59/88]
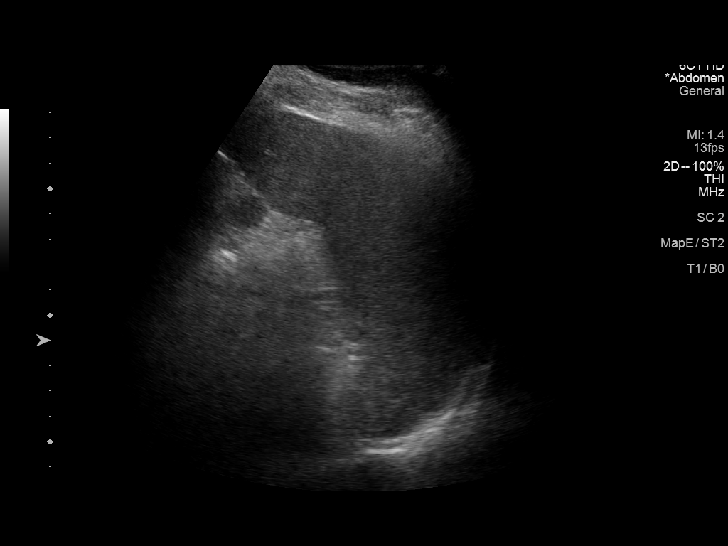
[im 66/88]
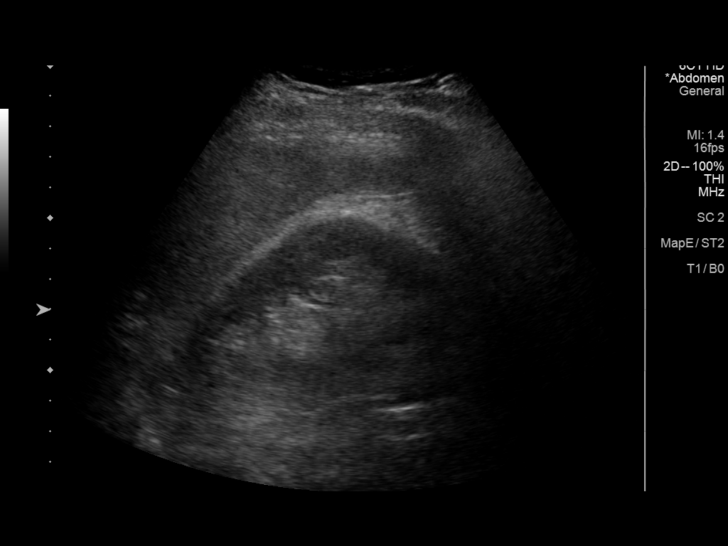
[im 73/88]
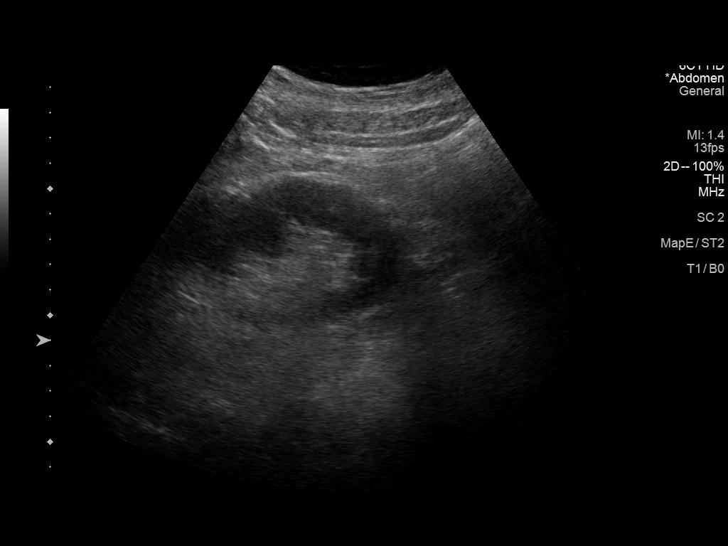
[im 80/88]
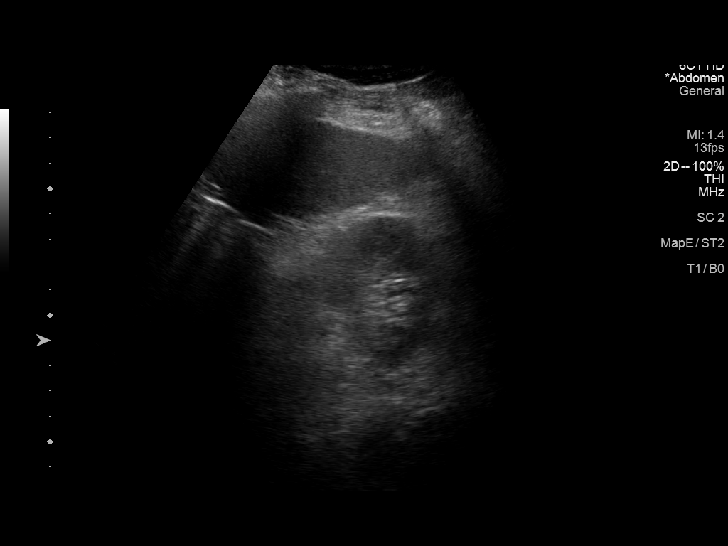
[im 88/88]
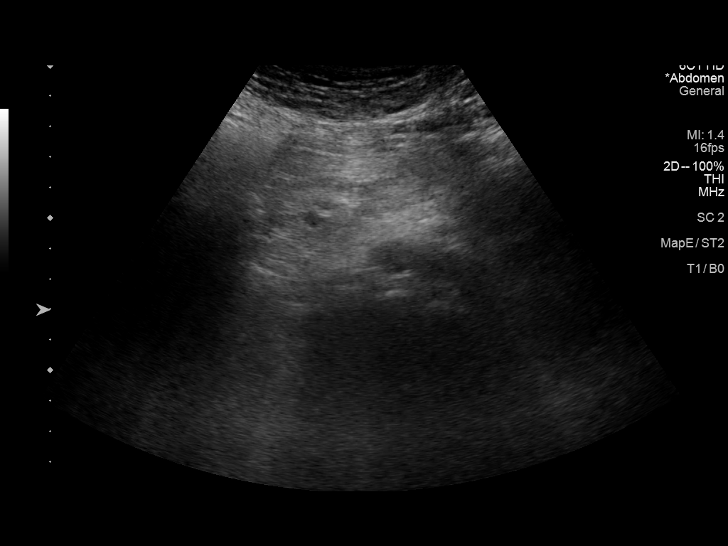

[14 of 25 positions shown; findings below may reference images not displayed]

FINDINGS: Gallbladder: Tiny gallstones noted. Mild amount of sludge noted.
Gallbladder wall thickness 2.2 mm. Negative Murphy sign.

Common bile duct: Diameter: 6.5 mm

Liver: Heterogeneous hepatic parenchymal pattern suggesting fatty
infiltration or hepatocellular disease. Portal vein is patent on
color Doppler imaging with normal direction of blood flow towards
the liver.

IVC: No abnormality visualized.

Pancreas: Visualized portion unremarkable.

Spleen: Size and appearance within normal limits.

Right Kidney: Length: 11.4 cm. Echogenicity within normal limits. No
mass or hydronephrosis visualized.

Left Kidney: Length: 12.6 cm. Echogenicity within normal limits. No
mass or hydronephrosis visualized.

Abdominal aorta: No aneurysm visualized.

Other findings: None.
IMPRESSION: 1. Tiny gallstones and small amount of sludge. No gallbladder wall
thickening or biliary distention.

2. Heterogeneous hepatic parenchymal pattern consistent fatty
infiltration or hepatocellular disease. No focal hepatic abnormality
identified.
